# Patient Record
Sex: Female | Born: 1955 | Hispanic: Yes | Marital: Married
Health system: Southern US, Community
[De-identification: ages and names within clinical notes are randomized; demographics above are authoritative.]

## PROBLEM LIST (undated history)

## (undated) DIAGNOSIS — D649 Anemia, unspecified: Secondary | ICD-10-CM

## (undated) DIAGNOSIS — N819 Female genital prolapse, unspecified: Secondary | ICD-10-CM

## (undated) DIAGNOSIS — D509 Iron deficiency anemia, unspecified: Secondary | ICD-10-CM

## (undated) DIAGNOSIS — K219 Gastro-esophageal reflux disease without esophagitis: Secondary | ICD-10-CM

## (undated) DIAGNOSIS — I10 Essential (primary) hypertension: Secondary | ICD-10-CM

## (undated) DIAGNOSIS — Z972 Presence of dental prosthetic device (complete) (partial): Secondary | ICD-10-CM

## (undated) DIAGNOSIS — E119 Type 2 diabetes mellitus without complications: Secondary | ICD-10-CM

## (undated) HISTORY — PX: ABDOMINAL HYSTERECTOMY: SHX81

## (undated) HISTORY — PX: HEMORRHOID SURGERY: SHX153

## (undated) HISTORY — PX: VARICOSE VEIN SURGERY: SHX832

---

## 1988-04-22 HISTORY — PX: VAGINAL HYSTERECTOMY: SUR661

## 1998-04-22 HISTORY — PX: BREAST CYST ASPIRATION: SHX578

## 2000-04-22 HISTORY — PX: BREAST BIOPSY: SHX20

## 2004-10-05 ENCOUNTER — Ambulatory Visit: Payer: Self-pay | Admitting: Family Medicine

## 2005-04-24 ENCOUNTER — Ambulatory Visit: Payer: Self-pay | Admitting: Family Medicine

## 2006-05-13 ENCOUNTER — Ambulatory Visit: Payer: Self-pay | Admitting: Family Medicine

## 2007-06-16 ENCOUNTER — Ambulatory Visit: Payer: Self-pay | Admitting: Family Medicine

## 2008-06-21 ENCOUNTER — Ambulatory Visit: Payer: Self-pay | Admitting: Family Medicine

## 2009-06-22 ENCOUNTER — Ambulatory Visit: Payer: Self-pay | Admitting: Internal Medicine

## 2010-03-09 ENCOUNTER — Ambulatory Visit: Payer: Self-pay | Admitting: Gastroenterology

## 2010-06-28 ENCOUNTER — Ambulatory Visit: Payer: Self-pay | Admitting: Internal Medicine

## 2011-08-13 ENCOUNTER — Ambulatory Visit: Payer: Self-pay | Admitting: Internal Medicine

## 2012-09-01 ENCOUNTER — Ambulatory Visit: Payer: Self-pay

## 2012-12-10 ENCOUNTER — Ambulatory Visit: Payer: Self-pay | Admitting: Surgery

## 2012-12-10 DIAGNOSIS — I10 Essential (primary) hypertension: Secondary | ICD-10-CM

## 2012-12-10 LAB — HEMOGLOBIN: HGB: 14.2 g/dL (ref 12.0–16.0)

## 2012-12-18 ENCOUNTER — Ambulatory Visit: Payer: Self-pay | Admitting: Surgery

## 2012-12-22 LAB — PATHOLOGY REPORT

## 2013-11-16 ENCOUNTER — Ambulatory Visit: Payer: Self-pay

## 2013-11-26 ENCOUNTER — Ambulatory Visit: Payer: Self-pay

## 2014-08-12 NOTE — Op Note (Signed)
PATIENT NAME:  Erica Peters, Erica Peters MR#:  [id number] DATE OF BIRTH:  01-31-1956  DATE OF PROCEDURE:  12/18/2012  PREOPERATIVE DIAGNOSIS: Hemorrhoids.   POSTOPERATIVE DIAGNOSIS: Hemorrhoids.   PROCEDURE: Internal and external hemorrhoidectomy.   SURGEON: Rochel Brome, M.D.   ANESTHESIA: General.   INDICATIONS: This 59 year old has a complaint of hemorrhoid, which is on the outside, which has been bothering her for a period of several years, gradually increasing in size, and now has developed another adjacent hemorrhoid. Has had minor discomfort, occasional bright red bleeding. Hemorrhoids were demonstrated on physical exam and hemorrhoidectomy recommended for definitive treatment.   DESCRIPTION OF PROCEDURE: The patient was placed on the operating table under general anesthesia and placed in the lithotomy position. The anal area was prepared with Betadine solution and draped in a sterile manner. The largest hemorrhoid was noted which is on the patient's right side and anterior. Next largest was on the patient's right side and more posterior. The anoderm was infiltrated with Exparel. The anal canal was dilated large enough to admit 3 fingers. The bivalve and anal retractor was introduced and further gently dilated the anal canal. There was a large internal and external hemorrhoid complex at the 11 o'clock position and another at the eight o'clock position and another fairly prominent internal hemorrhoid at the two o'clock position.   The hemorrhoid at the 11 o'clock position was removed first. Using a 2-0 chromic suture ligature at the upper extent of the internal component, a V-shaped incision was made externally and the hemorrhoid was dissected free from surrounding structures and followed up over the internal ring and dissected up to the previously placed suture ligature. The hemorrhoid was further ligated with 2-0 chromic and excised. The wound was inspected. Several small bleeding points were  cauterized. The wound was closed with a running, locked, tied 2-0 chromic leaving a small opening externally for drainage.   Next, the hemorrhoid at the eight o'clock position was removed, making an elliptical excision, which engulfed both internal and external hemorrhoids, and this was excised. Numerous adjacent small hemorrhoids were cauterized, and the wound was closed with interrupted 3-0 chromic sutures leaving small opening externally for drainage. Next, there was another internal hemorrhoid just at the nine o'clock position, which was elevated with a pick-ups and crossclamped with Kelly clamp and ligated with 2-0 chromic. There was another internal hemorrhoid at the two o'clock position, which was ligated in a similar manner.   Subsequently, it appeared that hemostasis was intact. The additional Exparel for a total of 20 mL was injected into the anoderm and tissues surrounding the sphincter. Next, dressings were applied with paper tape. The patient tolerated the surgery satisfactorily and was prepared for transfer to the recovery room.     ____________________________ Lenna Sciara. Rochel Brome, MD jws:dmm D: 12/18/2012 08:44:00 ET T: 12/18/2012 10:02:48 ET JOB#: [id number]  cc: Loreli Dollar, MD, <Dictator> Loreli Dollar MD ELECTRONICALLY SIGNED 12/18/2012 17:22

## 2015-04-13 IMAGING — MG MM ADDITIONAL VIEWS AT NO CHARGE
1 series · 3 of 3 positions shown · non-contrast
Comparison: Previous examinations, including the screening
mammogram dated 11/16/2013.

CLINICAL DATA: Possible right breast mass at recent screening
mammography.

EXAM:
DIGITAL DIAGNOSTIC  RIGHT MAMMOGRAM
ULTRASOUND RIGHT BREAST

[R CC · right · 3 of 3 slices shown]
[im 1/3]
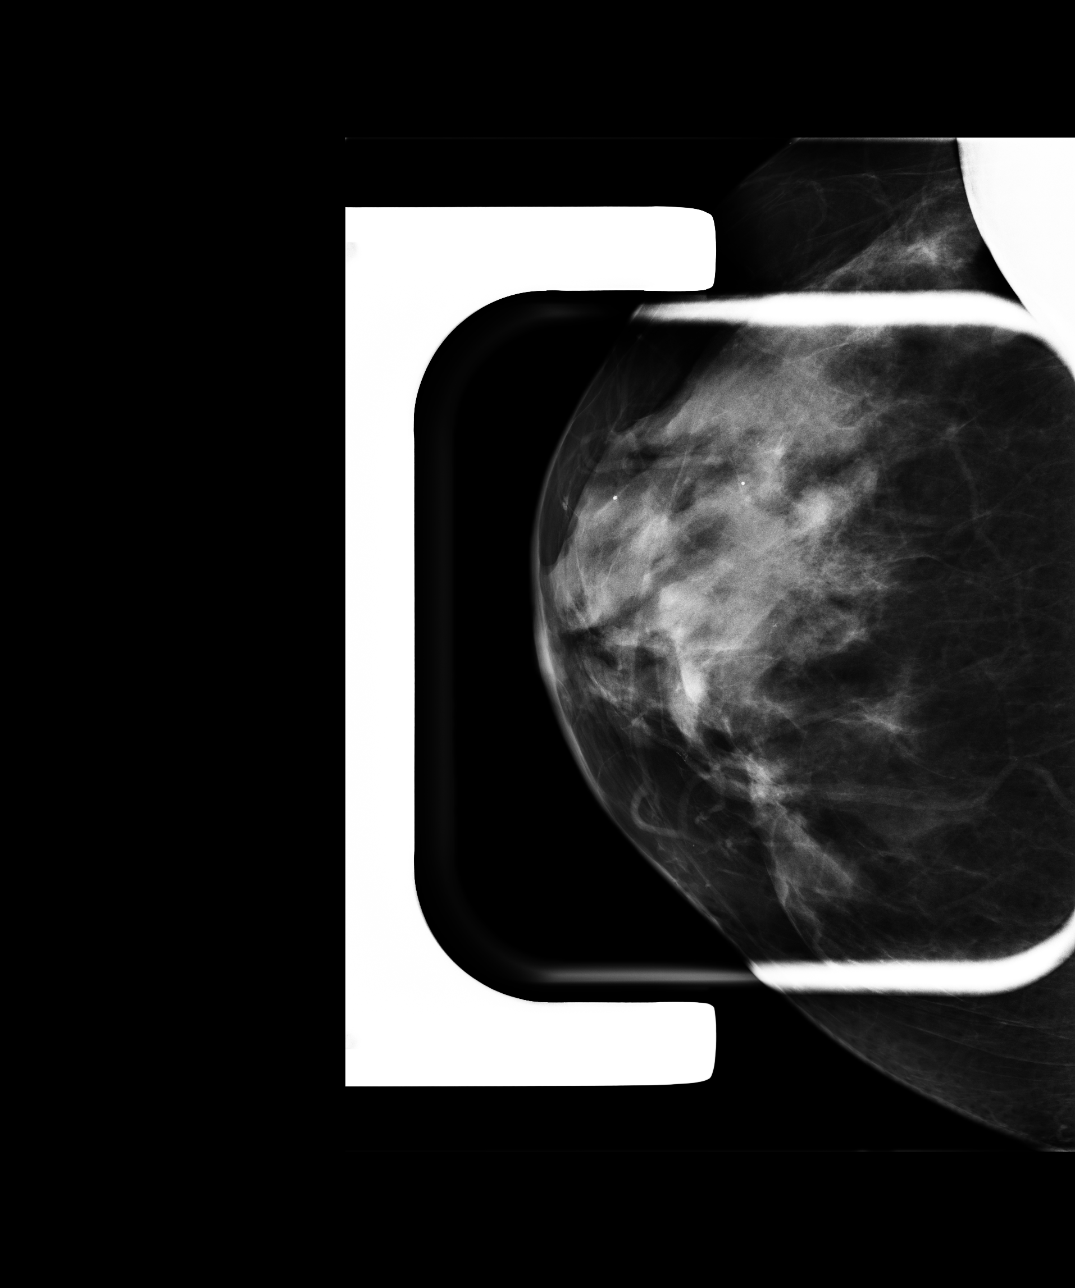
[im 2/3]
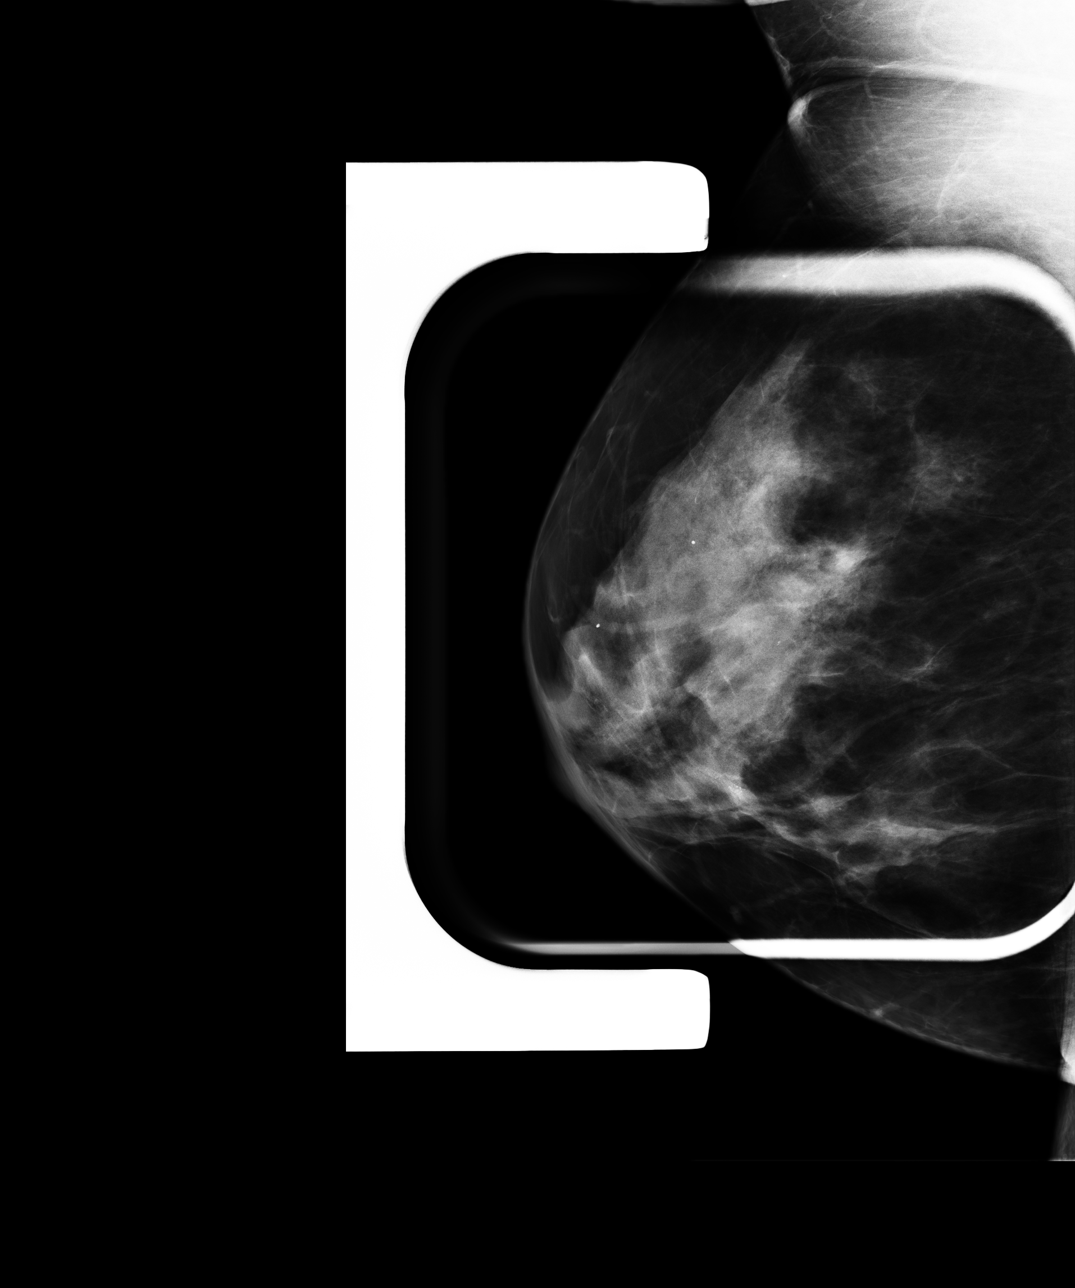
[im 3/3]
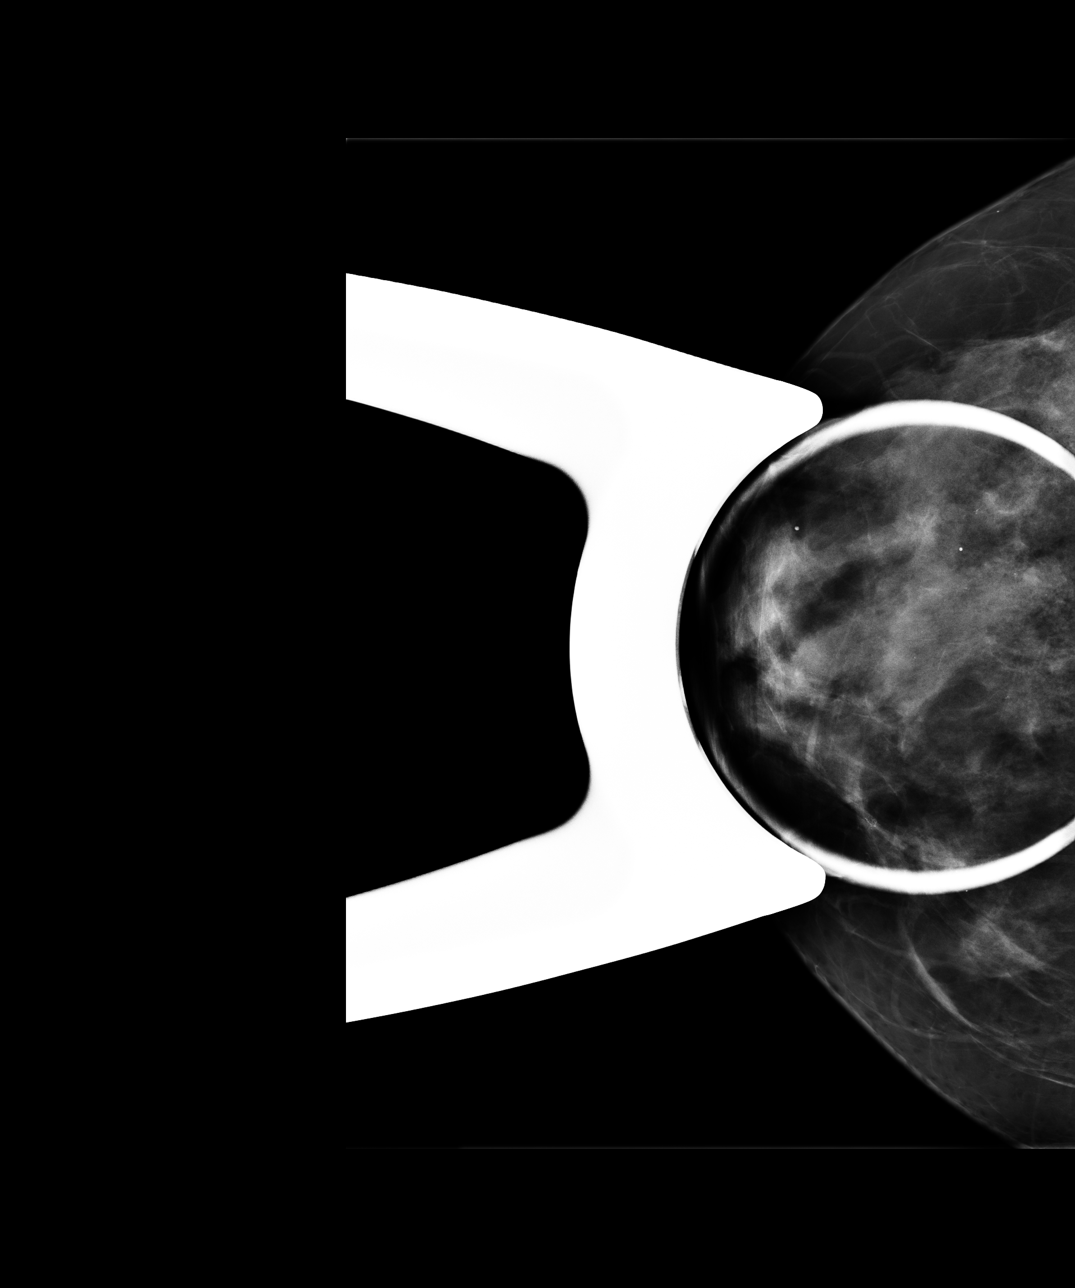

[3 of 3 positions shown; findings below may reference images not displayed]

ACR Breast Density Category c: The breast tissue is heterogeneously
dense, which may obscure small masses.
FINDINGS: Spot compression views of the right breast demonstrate normal
appearing dense glandular tissue at the location of the recently
suspected mass in the retroareolar region.

On physical exam, no mass is palpable in the retroareolar right
breast.

Ultrasound is performed, showing normal appearing breast tissue
throughout the right retroareolar and periareolar regions.
IMPRESSION: No evidence of malignancy. The recently suspected right breast mass
represented overlapping of normal glandular tissue.

RECOMMENDATION:
Bilateral screening mammogram in 1 year.

I have discussed the findings and recommendations with the patient.
Results were also provided in writing at the conclusion of the
visit. If applicable, a reminder letter will be sent to the patient
regarding the next appointment.

BI-RADS CATEGORY  1: Negative.

## 2015-06-14 ENCOUNTER — Other Ambulatory Visit: Payer: Self-pay | Admitting: Physician Assistant

## 2015-06-14 DIAGNOSIS — Z1231 Encounter for screening mammogram for malignant neoplasm of breast: Secondary | ICD-10-CM

## 2015-06-29 ENCOUNTER — Ambulatory Visit: Payer: Self-pay

## 2015-06-30 ENCOUNTER — Ambulatory Visit: Payer: Self-pay

## 2015-07-07 ENCOUNTER — Ambulatory Visit
Admission: RE | Admit: 2015-07-07 | Discharge: 2015-07-07 | Disposition: A | Discharge status: Home / Self Care | Payer: BLUE CROSS/BLUE SHIELD | Source: Ambulatory Visit | Attending: Physician Assistant | Admitting: Physician Assistant

## 2015-07-07 DIAGNOSIS — Z1231 Encounter for screening mammogram for malignant neoplasm of breast: Secondary | ICD-10-CM | POA: Insufficient documentation

## 2015-10-04 DIAGNOSIS — D509 Iron deficiency anemia, unspecified: Secondary | ICD-10-CM | POA: Insufficient documentation

## 2015-11-22 ENCOUNTER — Other Ambulatory Visit: Payer: Self-pay | Admitting: Podiatry

## 2015-11-22 DIAGNOSIS — M25571 Pain in right ankle and joints of right foot: Secondary | ICD-10-CM

## 2015-12-04 ENCOUNTER — Ambulatory Visit
Admission: RE | Admit: 2015-12-04 | Discharge: 2015-12-04 | Disposition: A | Discharge status: Home / Self Care | Payer: BLUE CROSS/BLUE SHIELD | Source: Ambulatory Visit | Attending: Podiatry | Admitting: Podiatry

## 2015-12-04 DIAGNOSIS — M19071 Primary osteoarthritis, right ankle and foot: Secondary | ICD-10-CM | POA: Diagnosis not present

## 2015-12-04 DIAGNOSIS — M25871 Other specified joint disorders, right ankle and foot: Secondary | ICD-10-CM | POA: Diagnosis not present

## 2015-12-04 DIAGNOSIS — R600 Localized edema: Secondary | ICD-10-CM | POA: Insufficient documentation

## 2015-12-04 DIAGNOSIS — M25571 Pain in right ankle and joints of right foot: Secondary | ICD-10-CM | POA: Diagnosis present

## 2016-01-18 ENCOUNTER — Inpatient Hospital Stay: Admission: RE | Admit: 2016-01-18 | Payer: BLUE CROSS/BLUE SHIELD | Source: Ambulatory Visit

## 2016-01-23 ENCOUNTER — Encounter
Admission: RE | Admit: 2016-01-23 | Discharge: 2016-01-23 | Disposition: A | Discharge status: Home / Self Care | Payer: BLUE CROSS/BLUE SHIELD | Source: Ambulatory Visit | Attending: Podiatry | Admitting: Podiatry

## 2016-01-23 DIAGNOSIS — D649 Anemia, unspecified: Secondary | ICD-10-CM | POA: Diagnosis not present

## 2016-01-23 DIAGNOSIS — M216X1 Other acquired deformities of right foot: Secondary | ICD-10-CM | POA: Diagnosis not present

## 2016-01-23 DIAGNOSIS — I10 Essential (primary) hypertension: Secondary | ICD-10-CM | POA: Diagnosis not present

## 2016-01-23 DIAGNOSIS — Z6833 Body mass index (BMI) 33.0-33.9, adult: Secondary | ICD-10-CM | POA: Diagnosis not present

## 2016-01-23 DIAGNOSIS — E669 Obesity, unspecified: Secondary | ICD-10-CM | POA: Diagnosis not present

## 2016-01-23 DIAGNOSIS — M19071 Primary osteoarthritis, right ankle and foot: Secondary | ICD-10-CM | POA: Diagnosis present

## 2016-01-23 HISTORY — DX: Anemia, unspecified: D64.9

## 2016-01-23 HISTORY — DX: Essential (primary) hypertension: I10

## 2016-01-23 NOTE — Patient Instructions (Signed)
  Your procedure is scheduled on: Friday 01/26/16 Report to Day Surgery. To find out your arrival time please call [phone] between 1PM - 3PM on Thurs. 01/25/16.  Remember: Instructions that are not followed completely may result in serious medical risk, up to and including death, or upon the discretion of your surgeon and anesthesiologist your surgery may need to be rescheduled.    _x__ 1. Do not eat food or drink liquids after midnight. No gum chewing or hard candies.     ___x_ 2. No Alcohol for 24 hours before or after surgery.   ____ 3. Do Not Smoke For 24 Hours Prior to Your Surgery.   ____ 4. Bring all medications with you on the day of surgery if instructed.    _x___ 5. Notify your doctor if there is any change in your medical condition     (cold, fever, infections).       Do not wear jewelry, make-up, hairpins, clips or nail polish.  Do not wear lotions, powders, or perfumes. You may wear deodorant.  Do not shave 48 hours prior to surgery. Men may shave face and neck.  Do not bring valuables to the hospital.    [facility] is not responsible for any belongings or valuables.               Contacts, dentures or bridgework may not be worn into surgery.  Leave your suitcase in the car. After surgery it may be brought to your room.  For patients admitted to the hospital, discharge time is determined by your                treatment team.   Patients discharged the day of surgery will not be allowed to drive home.   Please read over the following fact sheets that you were given:      __x__ Take these medicines the morning of surgery with A SIP OF WATER:    amLODipine (NORVASC) 5 MG tablet1.  2gabapentin (NEURONTIN) 100 MG capsule.   3.pantoprazole (PROTONIX) 40 MG tablet   4.  5.  6.  ____ Fleet Enema (as directed)   __x__ Use CHG Soap as directed  ____ Use inhalers on the day of surgery  ____ Stop metformin 2 days prior to surgery    ____ Take 1/2 of usual  insulin dose the night before surgery and none on the morning of surgery.   ____ Stop Coumadin/Plavix/aspirin on  __x__ Stop Anti-inflammatories on Meloxicam and osteo- bi flex ibuprofen, aspirin or aleve   ____ Stop supplements until after surgery.    ____ Bring C-Pap to the hospital.

## 2016-01-25 MED ORDER — CEFAZOLIN SODIUM-DEXTROSE 2-4 GM/100ML-% IV SOLN
2.0000 g | Freq: Once | INTRAVENOUS | Status: AC
  Administered 2016-01-26: 2 g via INTRAVENOUS

## 2016-01-26 ENCOUNTER — Ambulatory Visit: Payer: BLUE CROSS/BLUE SHIELD | Admitting: Anesthesiology

## 2016-01-26 ENCOUNTER — Encounter
Admission: RE | Disposition: A | Discharge status: Home / Self Care | Payer: Self-pay | Source: Ambulatory Visit | Attending: Podiatry

## 2016-01-26 ENCOUNTER — Ambulatory Visit
Admission: RE | Admit: 2016-01-26 | Discharge: 2016-01-26 | Disposition: A | Discharge status: Home / Self Care | Payer: BLUE CROSS/BLUE SHIELD | Source: Ambulatory Visit | Attending: Podiatry | Admitting: Podiatry

## 2016-01-26 ENCOUNTER — Encounter: Payer: Self-pay | Admitting: *Deleted

## 2016-01-26 ENCOUNTER — Ambulatory Visit: Payer: BLUE CROSS/BLUE SHIELD

## 2016-01-26 DIAGNOSIS — IMO0002 Reserved for concepts with insufficient information to code with codable children: Secondary | ICD-10-CM

## 2016-01-26 DIAGNOSIS — M216X1 Other acquired deformities of right foot: Secondary | ICD-10-CM | POA: Insufficient documentation

## 2016-01-26 DIAGNOSIS — R29898 Other symptoms and signs involving the musculoskeletal system: Secondary | ICD-10-CM

## 2016-01-26 DIAGNOSIS — M19071 Primary osteoarthritis, right ankle and foot: Secondary | ICD-10-CM | POA: Insufficient documentation

## 2016-01-26 DIAGNOSIS — Z6833 Body mass index (BMI) 33.0-33.9, adult: Secondary | ICD-10-CM | POA: Insufficient documentation

## 2016-01-26 DIAGNOSIS — E669 Obesity, unspecified: Secondary | ICD-10-CM | POA: Insufficient documentation

## 2016-01-26 DIAGNOSIS — D649 Anemia, unspecified: Secondary | ICD-10-CM | POA: Insufficient documentation

## 2016-01-26 DIAGNOSIS — I10 Essential (primary) hypertension: Secondary | ICD-10-CM | POA: Insufficient documentation

## 2016-01-26 HISTORY — PX: FOOT ARTHRODESIS: SHX1655

## 2016-01-26 HISTORY — PX: FLAT FOOT RECONSTRUCTION-TAL GASTROC RECESSION: SHX6620

## 2016-01-26 HISTORY — PX: TRIPLE SUBTALAR FUSION: SHX6633

## 2016-01-26 SURGERY — FLAT FOOT RECONSTRUCTION-TAL GASTROC RECESSION
Anesthesia: General | Laterality: Right | Wound class: Clean

## 2016-01-26 MED ORDER — MEPERIDINE HCL 25 MG/ML IJ SOLN: 6.2500 mg | INTRAMUSCULAR | Status: DC | PRN

## 2016-01-26 MED ORDER — FENTANYL CITRATE (PF) 100 MCG/2ML IJ SOLN
INTRAMUSCULAR | Status: DC | PRN
  Administered 2016-01-26: 50 ug via INTRAVENOUS
  Administered 2016-01-26: 25 ug via INTRAVENOUS
  Administered 2016-01-26: 50 ug via INTRAVENOUS
  Administered 2016-01-26 (×2): 25 ug via INTRAVENOUS
  Administered 2016-01-26: 100 ug via INTRAVENOUS
  Administered 2016-01-26: 25 ug via INTRAVENOUS

## 2016-01-26 MED ORDER — SUCCINYLCHOLINE CHLORIDE 20 MG/ML IJ SOLN
INTRAMUSCULAR | Status: DC | PRN
  Administered 2016-01-26: 80 mg via INTRAVENOUS

## 2016-01-26 MED ORDER — ONDANSETRON HCL 4 MG/2ML IJ SOLN
INTRAMUSCULAR | Status: DC | PRN
  Administered 2016-01-26: 4 mg via INTRAVENOUS

## 2016-01-26 MED ORDER — ONDANSETRON HCL 4 MG/2ML IJ SOLN: 4.0000 mg | Freq: Four times a day (QID) | INTRAMUSCULAR | Status: DC | PRN

## 2016-01-26 MED ORDER — ONDANSETRON HCL 4 MG PO TABS: 4.0000 mg | ORAL_TABLET | Freq: Four times a day (QID) | ORAL | Status: DC | PRN

## 2016-01-26 MED ORDER — BUPIVACAINE HCL (PF) 0.25 % IJ SOLN
INTRAMUSCULAR | Status: AC
  Filled 2016-01-26: qty 30

## 2016-01-26 MED ORDER — EPHEDRINE SULFATE 50 MG/ML IJ SOLN
INTRAMUSCULAR | Status: DC | PRN
  Administered 2016-01-26: 10 mg via INTRAVENOUS
  Administered 2016-01-26: 5 mg via INTRAVENOUS
  Administered 2016-01-26: 10 mg via INTRAVENOUS
  Administered 2016-01-26: 5 mg via INTRAVENOUS
  Administered 2016-01-26: 10 mg via INTRAVENOUS

## 2016-01-26 MED ORDER — LIDOCAINE-EPINEPHRINE 1 %-1:100000 IJ SOLN
INTRAMUSCULAR | Status: AC
  Filled 2016-01-26: qty 1

## 2016-01-26 MED ORDER — LIDOCAINE HCL (PF) 1 % IJ SOLN
INTRAMUSCULAR | Status: AC
Start: 2016-01-26 — End: 2016-01-26
  Filled 2016-01-26: qty 30

## 2016-01-26 MED ORDER — ACETAMINOPHEN 10 MG/ML IV SOLN
INTRAVENOUS | Status: AC
  Filled 2016-01-26: qty 100

## 2016-01-26 MED ORDER — OXYCODONE HCL 5 MG/5ML PO SOLN: 5.0000 mg | Freq: Once | ORAL | Status: AC | PRN

## 2016-01-26 MED ORDER — PROMETHAZINE HCL 25 MG/ML IJ SOLN: 6.2500 mg | INTRAMUSCULAR | Status: DC | PRN

## 2016-01-26 MED ORDER — LIDOCAINE-EPINEPHRINE 1 %-1:100000 IJ SOLN
INTRAMUSCULAR | Status: DC | PRN
  Administered 2016-01-26: 8 mL

## 2016-01-26 MED ORDER — FENTANYL CITRATE (PF) 100 MCG/2ML IJ SOLN: 25.0000 ug | INTRAMUSCULAR | Status: DC | PRN

## 2016-01-26 MED ORDER — ROPIVACAINE HCL 5 MG/ML IJ SOLN
INTRAMUSCULAR | Status: DC | PRN
  Administered 2016-01-26: 30 mL via PERINEURAL

## 2016-01-26 MED ORDER — OXYCODONE-ACETAMINOPHEN 5-325 MG PO TABS: 1.0000 | ORAL_TABLET | ORAL | Status: DC | PRN

## 2016-01-26 MED ORDER — DEXAMETHASONE SODIUM PHOSPHATE 10 MG/ML IJ SOLN
INTRAMUSCULAR | Status: DC | PRN
  Administered 2016-01-26: 10 mg via INTRAVENOUS

## 2016-01-26 MED ORDER — ACETAMINOPHEN 10 MG/ML IV SOLN
INTRAVENOUS | Status: DC | PRN
  Administered 2016-01-26: 1000 mg via INTRAVENOUS

## 2016-01-26 MED ORDER — LACTATED RINGERS IV SOLN
INTRAVENOUS | Status: DC
  Administered 2016-01-26 (×2): via INTRAVENOUS

## 2016-01-26 MED ORDER — BUPIVACAINE HCL 0.25 % IJ SOLN
INTRAMUSCULAR | Status: DC | PRN
  Administered 2016-01-26: 20 mL

## 2016-01-26 MED ORDER — BUPIVACAINE HCL (PF) 0.5 % IJ SOLN
INTRAMUSCULAR | Status: AC
  Filled 2016-01-26: qty 30

## 2016-01-26 MED ORDER — OXYCODONE HCL 5 MG PO TABS
5.0000 mg | ORAL_TABLET | Freq: Once | ORAL | Status: AC | PRN
  Administered 2016-01-26: 5 mg via ORAL

## 2016-01-26 MED ORDER — PROPOFOL 10 MG/ML IV BOLUS
INTRAVENOUS | Status: DC | PRN
  Administered 2016-01-26: 130 mg via INTRAVENOUS

## 2016-01-26 MED ORDER — OXYCODONE HCL 5 MG PO TABS
ORAL_TABLET | ORAL | Status: AC
  Filled 2016-01-26: qty 1

## 2016-01-26 MED ORDER — MIDAZOLAM HCL 2 MG/2ML IJ SOLN
INTRAMUSCULAR | Status: DC | PRN
  Administered 2016-01-26 (×2): 1 mg via INTRAVENOUS
  Administered 2016-01-26: 2 mg via INTRAVENOUS

## 2016-01-26 MED ORDER — OXYCODONE-ACETAMINOPHEN 5-325 MG PO TABS: 1.0000 | ORAL_TABLET | ORAL | 0 refills | Status: DC | PRN

## 2016-01-26 MED ORDER — CEFAZOLIN SODIUM-DEXTROSE 2-4 GM/100ML-% IV SOLN
INTRAVENOUS | Status: AC
  Filled 2016-01-26: qty 100

## 2016-01-26 SURGICAL SUPPLY — 100 items
1.6 mm wire ×8 IMPLANT
2.3 mm wire ×4 IMPLANT
2.4 mm drill bit ×2 IMPLANT
4.6 mm cannulated drill ×2 IMPLANT
7.0 countersink ×2 IMPLANT
BAG COUNTER SPONGE EZ (MISCELLANEOUS) IMPLANT
BANDAGE ELASTIC 4 LF NS (GAUZE/BANDAGES/DRESSINGS) IMPLANT
BANDAGE STRETCH 3X4.1 STRL (GAUZE/BANDAGES/DRESSINGS) IMPLANT
BASIN GRAD PLASTIC 32OZ STRL (MISCELLANEOUS) ×2 IMPLANT
BIT DRILL 2.4X140 LONG SOLID (BIT) ×2 IMPLANT
BIT DRILL CANNULATED 4.6 (BIT) ×2 IMPLANT
BLADE MED AGGRESSIVE (BLADE) IMPLANT
BLADE OSCILLATING/SAGITTAL (BLADE)
BLADE SURG 15 STRL LF DISP TIS (BLADE) ×3 IMPLANT
BLADE SURG 15 STRL SS (BLADE) ×3
BLADE SURG MINI STRL (BLADE) ×2 IMPLANT
BLADE SW THK.38XMED LNG THN (BLADE) IMPLANT
BNDG COHESIVE 4X5 TAN STRL (GAUZE/BANDAGES/DRESSINGS) ×2 IMPLANT
BNDG ESMARK 4X12 TAN STRL LF (GAUZE/BANDAGES/DRESSINGS) ×2 IMPLANT
BNDG GAUZE 4.5X4.1 6PLY STRL (MISCELLANEOUS) IMPLANT
BONE MATRIX TRINITY 10CC (Bone Implant) ×2 IMPLANT
BUR 4X45 EGG (BURR) ×2 IMPLANT
BUR 4X55 1 (BURR) IMPLANT
CANISTER SUCT 1200ML W/VALVE (MISCELLANEOUS) ×2 IMPLANT
CATH TRAY METER 16FR LF (MISCELLANEOUS) IMPLANT
COUNTERSINK 7.0 (MISCELLANEOUS) ×2
COVER PIN YLW 0.028-062 (MISCELLANEOUS) IMPLANT
CUFF TOURN 18 STER (MISCELLANEOUS) IMPLANT
CUFF TOURN SGL QUICK 24 (TOURNIQUET CUFF)
CUFF TRNQT CYL 24X4X40X1 (TOURNIQUET CUFF) IMPLANT
DRAPE C-ARM XRAY 36X54 (DRAPES) ×2 IMPLANT
DRAPE C-ARMOR (DRAPES) ×2 IMPLANT
DRAPE FLUOR MINI C-ARM 54X84 (DRAPES) IMPLANT
DRAPE SURG 17X11 SM STRL (DRAPES) ×2 IMPLANT
DRSG TEGADERM 4X4.75 (GAUZE/BANDAGES/DRESSINGS) IMPLANT
DURAPREP 26ML APPLICATOR (WOUND CARE) ×4 IMPLANT
ELECT REM PT RETURN 9FT ADLT (ELECTROSURGICAL) ×2
ELECTRODE REM PT RTRN 9FT ADLT (ELECTROSURGICAL) ×1 IMPLANT
GAUZE PETRO XEROFOAM 1X8 (MISCELLANEOUS) IMPLANT
GAUZE STRETCH 2X75IN STRL (MISCELLANEOUS) IMPLANT
GLOVE BIO SURGEON STRL SZ7.5 (GLOVE) ×6 IMPLANT
GLOVE INDICATOR 8.0 STRL GRN (GLOVE) ×6 IMPLANT
GOWN STRL REUS W/ TWL LRG LVL3 (GOWN DISPOSABLE) ×3 IMPLANT
GOWN STRL REUS W/TWL LRG LVL3 (GOWN DISPOSABLE) ×3
K-WIRE 2.3 (WIRE) ×4 IMPLANT
K-WIRE SMOOTH 1.6X150MM (WIRE) ×8
KIT RM TURNOVER STRD PROC AR (KITS) ×2 IMPLANT
KIT SHOULDER TRACTION (DRAPES) IMPLANT
KWIRE SMOOTH 1.6X150MM (WIRE) ×4 IMPLANT
LABEL OR SOLS (LABEL) ×2 IMPLANT
MANIPULATOR VCARE STD CRV RETR (MISCELLANEOUS) IMPLANT
NDL SAFETY 18GX1.5 (NEEDLE) ×2 IMPLANT
NEEDLE FILTER BLUNT 18X 1/2SAF (NEEDLE)
NEEDLE FILTER BLUNT 18X1 1/2 (NEEDLE) IMPLANT
NEEDLE HYPO 25X1 1.5 SAFETY (NEEDLE) ×6 IMPLANT
NS IRRIG 1000ML POUR BTL (IV SOLUTION) ×2 IMPLANT
NS IRRIG 500ML POUR BTL (IV SOLUTION) ×2 IMPLANT
PACK EXTREMITY ARMC (MISCELLANEOUS) ×2 IMPLANT
PADDING CAST BLEND 4X4 NS (MISCELLANEOUS) IMPLANT
PENCIL ELECTRO HAND CTR (MISCELLANEOUS) ×2 IMPLANT
PLATE 20MM DOGBONE (Plate) ×2 IMPLANT
SCREW 3.5X26 NONLOCKING (Screw) ×2 IMPLANT
SCREW 3.5X32 NONLOCKING (Screw) ×2 IMPLANT
SCREW 7.0X60 ST (Screw) ×2 IMPLANT
SCREW COUNTERSINK 7.0 (MISCELLANEOUS) ×1 IMPLANT
SCREW LOCK PLATE R3 3.5X20 (Screw) ×4 IMPLANT
SCREW LOCK PLATE R3 3.5X22 (Screw) ×2 IMPLANT
SCREW LOCK PLATE R3 3.5X24 (Screw) ×2 IMPLANT
SPLINT CAST 1 STEP 4X30 (MISCELLANEOUS) IMPLANT
SPLINT FAST PLASTER 5X30 (CAST SUPPLIES)
SPLINT PLASTER CAST FAST 5X30 (CAST SUPPLIES) IMPLANT
SPONGE XRAY 4X4 16PLY STRL (MISCELLANEOUS) ×2 IMPLANT
STAPLE NIT SUPER 18X18X15 (Staple) ×2 IMPLANT
STOCKINETTE M/LG 89821 (MISCELLANEOUS) ×2 IMPLANT
STOCKINETTE STRL 6IN 960660 (GAUZE/BANDAGES/DRESSINGS) ×2 IMPLANT
STRAP SAFETY BODY (MISCELLANEOUS) ×2 IMPLANT
STRIP CLOSURE SKIN 1/4X4 (GAUZE/BANDAGES/DRESSINGS) ×2 IMPLANT
SUT ETHILON 3-0 (SUTURE) ×4 IMPLANT
SUT ETHILON 3-0 FS-10 30 BLK (SUTURE) ×2
SUT ETHILON 4-0 (SUTURE)
SUT ETHILON 4-0 FS2 18XMFL BLK (SUTURE)
SUT ETHILON 5-0 FS-2 18 BLK (SUTURE) IMPLANT
SUT MNCRL 4-0 (SUTURE) ×1
SUT MNCRL 4-0 27XMFL (SUTURE) ×1
SUT MNCRL+ 5-0 UNDYED PC-3 (SUTURE) IMPLANT
SUT MONOCRYL 5-0 (SUTURE)
SUT VIC AB 2-0 CT2 27 (SUTURE) IMPLANT
SUT VIC AB 3-0 SH 27 (SUTURE) ×1
SUT VIC AB 3-0 SH 27X BRD (SUTURE) ×1 IMPLANT
SUT VIC AB 4-0 FS2 27 (SUTURE) ×4 IMPLANT
SUT VICRYL AB 3-0 FS1 BRD 27IN (SUTURE) ×2 IMPLANT
SUTURE EHLN 3-0 FS-10 30 BLK (SUTURE) ×1 IMPLANT
SUTURE ETHLN 4-0 FS2 18XMF BLK (SUTURE) IMPLANT
SUTURE MNCRL 4-0 27XMF (SUTURE) ×1 IMPLANT
SYR 3ML LL SCALE MARK (SYRINGE) IMPLANT
SYRINGE 10CC LL (SYRINGE) ×4 IMPLANT
TOWEL OR 17X26 4PK STRL BLUE (TOWEL DISPOSABLE) ×2 IMPLANT
WIRE MAGNUM (SUTURE) IMPLANT
WIRE Z .045 C-WIRE SPADE TIP (WIRE) IMPLANT
WIRE Z .062 C-WIRE SPADE TIP (WIRE) IMPLANT

## 2016-01-26 NOTE — Discharge Instructions (Signed)
These instructions will give you an idea of what to expect after surgery and how to manage issues that may arise before your first post op office visit.  Pain Management Pain is best managed by staying ahead of it. If pain gets out of control, it is difficult to get it back under control. Local anesthesia that lasts 6-8 hours and may last 24 hours is used to numb the foot and decrease pain.  For the best pain control, take the pain medication every 4 hours for the first 2 days post op. On the third day pain medication can be taken as needed.   Post Op Nausea Nausea is common after surgery, so it is managed proactively.  If prescribed, use the prescribed nausea medication regularly for the first 2 days post op.  Bandages Do not worry if there is blood on the bandage. What looks like a lot of blood on the bandage is actually a small amount. Blood on the dressing spreads out as it is absorbed by the gauze, the same way a drop of water spreads out on a paper towel.    If the bandages feel wet or dry, stiff and uncomfortable, call the office during office hours and we will schedule a time for you to have the bandage changed.  Unless you are specifically told otherwise, we will do the first bandage change in the office.  Keep your bandage dry. If the bandage becomes wet or soiled, notify the office and we will schedule a time to change the bandage.  Activity It is best to spend most of the first 2 days after surgery lying down with the foot elevated above the level of your heart. You may put weight on your heel while wearing the surgical shoe.   You may only get up to go to the restroom.  Driving Do not drive until you are able to respond in an emergency (i.e. slam on the brakes). This usually occurs after the bone has healed - 6 to 8 weeks.  Call the Office If you have a fever over 101F.  If you have increasing pain after the initial post op pain has settled down.  If you have increasing  redness, swelling, or drainage.  If you have any questions or concerns. [place] DR. [place]   Take your medication as prescribed.  Pain medication should be taken only as needed.  Keep the dressing clean, dry and intact.  Keep your foot elevated above the heart level for the first 48 hours.  Walking to the bathroom and brief periods of walking are acceptable, unless we have instructed you to be non-weight bearing.  Always wear your post-op shoe when walking.  Always use your crutches if you are to be non-weight bearing.  Do not take a shower. Baths are permissible as long as the foot is kept out of the water.   Every hour you are awake:  Bend your knee 15 times.   Call [facility] [phone]) if any of the following problems occur: You develop a temperature or fever. The bandage becomes saturated with blood. Medication does not stop your pain. Injury of the foot occurs. Any symptoms of infection including redness, odor, or red streaks running from wound.    CIRUGIA AMBULATORIA       Instruccionnes de alta    Date Toma Copier) 01/26/16   1.  Las [employer]  se le Engineer, manufacturing en su cuerpo [employer], asi      que por las proximas 24 horas usted no debe:   Conducir Scientist, research (medical)) un automovil   Hacer ninguna decision legal   Tomar ninguna bebida alcoholica  2.  A) [place] puede comenzar una dieta regular.  Es mejor que hoy empiece con                    liquidos y gradualmente anada comidas solidas.       B) Puede comer cualquier comida que desee pero es mejor empezar con liquidos,               luego sopitas con galletas saladas y gradualmente llegar a las comidas solidas.  3.  Por favor avise a su medico inmediatamente si usted tiene algun sangrado anormal,       tiene dificultad con la respiracion, enrojecimiento y Social research officer, government en el  sitio de la cirugia,     [place], fiebro o dolor que se alivia con [place].  4.  A) Su visita posoperatoria (despues de su operacion) es con el  Dr. Vickki Muff  Date 02/01/16                  Time 3:30 pm        B)  Por favor llame para hacer la cita posoperatoria.  5.  Istrucciones especificas :

## 2016-01-26 NOTE — Anesthesia Procedure Notes (Signed)
Anesthesia Regional Block:  Popliteal block  Pre-Anesthetic Checklist: ,, timeout performed, Correct Patient, Correct Site, Correct Laterality, Correct Procedure, Correct Position, site marked, Risks and benefits discussed,  Surgical consent,  Pre-op evaluation,  At surgeon's request and post-op pain management  Laterality: Right  Prep: chloraprep       Needles:  Injection technique: Single-shot  Needle Type: Stimiplex     Needle Length: 9cm 9 cm Needle Gauge: 22 and 22 G    Additional Needles:  Procedures: ultrasound guided (picture in chart) Popliteal block Narrative:  Start time: 01/26/2016 7:40 AM End time: 01/26/2016 7:50 AM Injection made incrementally with aspirations every 5 mL.  Performed by: Personally  Anesthesiologist: Carrol Bondar

## 2016-01-26 NOTE — Transfer of Care (Signed)
Immediate Anesthesia Transfer of Care Note  Patient: Erica Peters  Procedure(s) Performed: Procedure(s): FLAT FOOT RECONSTRUCTION-TAL GASTROC RECESSION (Right) ARTHRODESIS FOOT (Right) TRIPLE SUBTALAR FUSION (Right)  Patient Location: PACU  Anesthesia Type:General  Level of Consciousness: patient cooperative and lethargic  Airway & Oxygen Therapy: Patient Spontanous Breathing and Patient connected to face mask oxygen  Post-op Assessment: Report given to RN and Post -op Vital signs reviewed and stable  Post vital signs: Reviewed and stable  Last Vitals:  Vitals:   01/26/16 0625 01/26/16 1106  BP: (!) 145/109 (!) 110/56  Pulse: 82 73  Resp: 18 19  Temp: 36.6 C 36.9 C    Last Pain:  Vitals:   01/26/16 1106  TempSrc:   PainSc: Asleep         Complications: No apparent anesthesia complications

## 2016-01-26 NOTE — Anesthesia Procedure Notes (Signed)
Procedure Name: Intubation Date/Time: 01/26/2016 7:46 AM Performed by: Jonna Clark Pre-anesthesia Checklist: Patient identified, Patient being monitored, Timeout performed, Emergency Drugs available and Suction available Patient Re-evaluated:Patient Re-evaluated prior to inductionOxygen Delivery Method: Circle system utilized Preoxygenation: Pre-oxygenation with 100% oxygen Intubation Type: IV induction Ventilation: Mask ventilation without difficulty Laryngoscope Size: Miller and 2 Grade View: Grade II Tube type: Oral Tube size: 7.0 mm Number of attempts: 1 Placement Confirmation: ETT inserted through vocal cords under direct vision,  positive ETCO2 and breath sounds checked- equal and bilateral Secured at: 21 cm Tube secured with: Tape Dental Injury: Teeth and Oropharynx as per pre-operative assessment

## 2016-01-26 NOTE — Anesthesia Postprocedure Evaluation (Signed)
Anesthesia Post Note  Patient: Erica Peters  Procedure(s) Performed: Procedure(s) (LRB): FLAT FOOT RECONSTRUCTION-TAL GASTROC RECESSION (Right) ARTHRODESIS FOOT (Right) TRIPLE SUBTALAR FUSION (Right)  Patient location during evaluation: PACU Anesthesia Type: General Level of consciousness: awake and alert and oriented Pain management: pain level controlled Vital Signs Assessment: post-procedure vital signs reviewed and stable Respiratory status: spontaneous breathing, nonlabored ventilation and respiratory function stable Cardiovascular status: blood pressure returned to baseline and stable Postop Assessment: no signs of nausea or vomiting Anesthetic complications: no    Last Vitals:  Vitals:   01/26/16 1149 01/26/16 1157  BP: 113/60 (!) 110/59  Pulse: 88 88  Resp: 19 16  Temp: 36.8 C 37 C    Last Pain:  Vitals:   01/26/16 1157  TempSrc: Temporal  PainSc:                  Treasure Ochs

## 2016-01-26 NOTE — Progress Notes (Signed)
Voided x2 

## 2016-01-26 NOTE — Op Note (Signed)
Operative note   Surgeon:Ebonye Reade    Assistant:none    Preop diagnosis:1. Equinus right lower leg  2.Subtalar joint arthritis right foot  3.Talo-navicular joint arthritis    Postop diagnosis:same    Procedure:1. Rica Mote gastroc recession right lower leg  2. Subtalar joint fusion  3. Talonavicular joint fusion    EBL: 25 ML's    Anesthesia:Popliteal block    Hemostasis: Thigh tourniquet inflated to 250 mmHg for 120 minutes    Specimen: None    Complications: None    Operative indications:Erica Peters is an 60 y.o. that presents today for surgical intervention.  The risks/benefits/alternatives/complications have been discussed and consent has been given.    Procedure:  Patient was brought into the OR and placed on the operating table in thesupine position. After anesthesia was obtained theright lower extremity was prepped and draped in usual sterile fashion.  Attention was initially directed to the posterior aspect of the calf where long incision site this was injected with 9 cc of 1% lidocaine with epinephrine. A longitudinal incision was made at the gastrocsoleus junction. Sharp and blunt dissection carried down to the peritenon. The peritenon was incised longitudinally. The small saphenous and sural nerve were retracted throughout the procedure. A Strayer medial to lateral gastroc recession was noted. Good excursion of the foot was noted. The wound was flushed with copious amounts or irrigation. The peritenon was closed with 4-0 Vicryl in the subcutaneous tissue closed with 4-0 Vicryl. The skin closed with a 4-0 Monocryl.  This area was then covered and dressed and inflation of the tourniquet was performed. Attention was directed to the lateral aspect of the subtalar joint where ollier  type of incision was performed. This was taken from the distal tip of the fibula overlying the calcaneocuboid joint region. The extensor digitorum brevis muscle belly was noted and  reflected distally. The subtalar joint was then entered. There was noted be some arthritic changes with loss of the posterior aspect of the subtalar joint articular cartilage. At this time the subtalar joint was then repaired with removal of all residual articular cartilage from the posterior medial and anterior facets. This was taken down to subchondral bone. The area was then smoothed with a power bur and drilled with a 1.5 mm drill bit.  Attention was then directed to the anterior aspect of the foot overlying the talonavicular joint. Just lateral to the anterior tibial tendon longitudinal incision was made. Sharp and blunt dissection carried down to the subcutaneous tissue. Care was taken to retract all vital neurovascular structures. The talonavicular joint was then entered and exposed medial and laterally. Using curettage and was able to remove all the articular cartilage. This was taken down to subchondral bone this was then smoothed with a power bur and drilled with a 1.5 mm drill bit. After preparation of all joints and flushing of all joints. Each area was then packed with Trinity bone graft. At this time a compression screw was placed from the posterior aspect of the calcaneus crossing the subtalar joint with excellent compression and alignment. This was noted in all views. This was from the [facility] 28 implant set. This screw was a 7.0 x 60 mm screw. Excellent compression was noted at this time.  The talonavicular joint was then held in a anatomic position and initially a compression staple was placed across the dorsal aspect of the TN joint. This was then 18 x 15 compression staple. Finally a 928 4 hole compression plate was placed across the  talonavicular joint. 43.5 mm locking screws were used. One 3.5 mm nonlocking screw was used for compression. Alignment and compression and stability was noted in all 3 planes. All superficial wounds were then flushed with copious varus or irrigation. After final  x-rays performed. Layered closure was performed with 304 0 Vicryl for the deeper and subcutaneous tissues. Skin was closed with a 3-0 nylon. 0.25% Marcaine was placed around all areas. A well compressive sterile dressing was then applied. She was then placed in a equalizer walker boot for nonweightbearing.    Patient tolerated the procedure and anesthesia well.  Was transported from the OR to the PACU with all vital signs stable and vascular status intact. To be discharged per routine protocol.  Will follow up in approximately 1 week in the outpatient clinic.

## 2016-01-26 NOTE — Anesthesia Preprocedure Evaluation (Addendum)
Anesthesia Evaluation  Patient identified by MRN, date of birth, ID band Patient awake    Reviewed: Allergy & Precautions, NPO status , Patient's Chart, lab work & pertinent test results  History of Anesthesia Complications Negative for: history of anesthetic complications  Airway Mallampati: III  TM Distance: >3 FB Neck ROM: Full    Dental  (+) Missing, Poor Dentition   Pulmonary neg pulmonary ROS, neg sleep apnea, neg COPD,    breath sounds clear to auscultation- rhonchi (-) wheezing      Cardiovascular Exercise Tolerance: Good hypertension, Pt. on medications (-) CAD and (-) Past MI  Rhythm:Regular Rate:Normal - Systolic murmurs and - Diastolic murmurs    Neuro/Psych negative neurological ROS  negative psych ROS   GI/Hepatic negative GI ROS, Neg liver ROS,   Endo/Other  negative endocrine ROSneg diabetes  Renal/GU negative Renal ROS     Musculoskeletal   Abdominal (+) + obese,   Peds  Hematology  (+) anemia ,   Anesthesia Other Findings Past Medical History: No date: Anemia No date: Hypertension   Reproductive/Obstetrics                             Anesthesia Physical Anesthesia Plan  ASA: II  Anesthesia Plan: General   Post-op Pain Management:  Regional for Post-op pain   Induction: Intravenous  Airway Management Planned: Oral ETT  Additional Equipment:   Intra-op Plan:   Post-operative Plan: Extubation in OR  Informed Consent: I have reviewed the patients History and Physical, chart, labs and discussed the procedure including the risks, benefits and alternatives for the proposed anesthesia with the patient or authorized representative who has indicated his/her understanding and acceptance.   Dental advisory given  Plan Discussed with: CRNA and Anesthesiologist  Anesthesia Plan Comments:         Anesthesia Quick Evaluation

## 2016-01-26 NOTE — H&P (Signed)
HISTORY AND PHYSICAL INTERVAL NOTE:  01/26/2016  7:14 AM  Erica Peters  has presented today for surgery, with the diagnosis of ACUTE FOOT PAIN.  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  The various methods of treatment have been discussed with the patient.  No guarantees were given.  After consideration of risks, benefits and other options for treatment, the patient has consented to surgery.  I have reviewed the patients' chart and labs.    Patient Vitals for the past 24 hrs:  BP Temp Temp src Pulse Resp SpO2 Height Weight  01/26/16 0625 (!) 145/109 97.9 F (36.6 C) Tympanic 82 18 98 % 5' (1.524 m) 77.1 kg (170 lb)    Peters history and physical examination was performed in my office.  The patient was reexamined.  There have been no changes to this history and physical examination.  Erica Peters

## 2016-01-26 NOTE — Progress Notes (Signed)
Toes warm to touch on right

## 2016-01-29 ENCOUNTER — Encounter: Payer: Self-pay | Admitting: Podiatry

## 2016-02-20 ENCOUNTER — Encounter: Payer: Self-pay | Admitting: Podiatry

## 2016-06-26 ENCOUNTER — Other Ambulatory Visit: Payer: Self-pay | Admitting: Physician Assistant

## 2016-06-26 DIAGNOSIS — K219 Gastro-esophageal reflux disease without esophagitis: Secondary | ICD-10-CM | POA: Insufficient documentation

## 2016-06-26 DIAGNOSIS — Z1231 Encounter for screening mammogram for malignant neoplasm of breast: Secondary | ICD-10-CM

## 2016-07-23 ENCOUNTER — Ambulatory Visit
Admission: RE | Admit: 2016-07-23 | Discharge: 2016-07-23 | Disposition: A | Discharge status: Home / Self Care | Payer: BLUE CROSS/BLUE SHIELD | Source: Ambulatory Visit | Attending: Physician Assistant | Admitting: Physician Assistant

## 2016-07-23 DIAGNOSIS — Z1231 Encounter for screening mammogram for malignant neoplasm of breast: Secondary | ICD-10-CM | POA: Diagnosis not present

## 2017-01-01 DIAGNOSIS — T148XXA Other injury of unspecified body region, initial encounter: Secondary | ICD-10-CM | POA: Insufficient documentation

## 2017-03-04 DIAGNOSIS — R208 Other disturbances of skin sensation: Secondary | ICD-10-CM | POA: Insufficient documentation

## 2017-03-04 DIAGNOSIS — R200 Anesthesia of skin: Secondary | ICD-10-CM | POA: Insufficient documentation

## 2017-04-01 ENCOUNTER — Other Ambulatory Visit: Payer: Self-pay | Admitting: Physician Assistant

## 2017-04-01 DIAGNOSIS — M5416 Radiculopathy, lumbar region: Secondary | ICD-10-CM

## 2017-04-09 ENCOUNTER — Ambulatory Visit
Admission: RE | Admit: 2017-04-09 | Discharge: 2017-04-09 | Disposition: A | Discharge status: Home / Self Care | Payer: No Typology Code available for payment source | Source: Ambulatory Visit | Attending: Physician Assistant | Admitting: Physician Assistant

## 2017-04-09 DIAGNOSIS — M5416 Radiculopathy, lumbar region: Secondary | ICD-10-CM | POA: Insufficient documentation

## 2017-04-09 DIAGNOSIS — M48061 Spinal stenosis, lumbar region without neurogenic claudication: Secondary | ICD-10-CM | POA: Diagnosis not present

## 2017-04-09 LAB — POCT I-STAT CREATININE: Creatinine, Ser: 0.6 mg/dL (ref 0.44–1.00)

## 2017-04-09 MED ORDER — GADOBENATE DIMEGLUMINE 529 MG/ML IV SOLN
15.0000 mL | Freq: Once | INTRAVENOUS | Status: AC | PRN
  Administered 2017-04-09: 15 mL via INTRAVENOUS

## 2017-05-20 DIAGNOSIS — M545 Low back pain, unspecified: Secondary | ICD-10-CM | POA: Insufficient documentation

## 2017-05-22 ENCOUNTER — Other Ambulatory Visit: Payer: Self-pay | Admitting: Orthopedic Surgery

## 2017-05-22 DIAGNOSIS — G8929 Other chronic pain: Secondary | ICD-10-CM

## 2017-05-22 DIAGNOSIS — M545 Low back pain: Principal | ICD-10-CM

## 2017-06-05 ENCOUNTER — Ambulatory Visit
Admission: RE | Admit: 2017-06-05 | Discharge: 2017-06-05 | Disposition: A | Discharge status: Home / Self Care | Payer: No Typology Code available for payment source | Source: Ambulatory Visit | Attending: Orthopedic Surgery | Admitting: Orthopedic Surgery

## 2017-06-05 DIAGNOSIS — G8929 Other chronic pain: Secondary | ICD-10-CM

## 2017-06-05 DIAGNOSIS — M545 Low back pain: Principal | ICD-10-CM

## 2017-06-05 MED ORDER — DIAZEPAM 5 MG PO TABS
10.0000 mg | ORAL_TABLET | Freq: Once | ORAL | Status: AC
  Administered 2017-06-05: 10 mg via ORAL

## 2017-06-05 MED ORDER — IOPAMIDOL (ISOVUE-M 200) INJECTION 41%
15.0000 mL | Freq: Once | INTRAMUSCULAR | Status: AC
  Administered 2017-06-05: 15 mL via INTRATHECAL

## 2017-06-05 NOTE — Discharge Instructions (Signed)
Myelogram Discharge Instructions  1. Go home and rest quietly for the next 24 hours.  It is important to lie flat for the next 24 hours.  Get up only to go to the restroom.  You may lie in the bed or on a couch on your back, your stomach, your left side or your right side.  You may have one pillow under your head.  You may have pillows between your knees while you are on your side or under your knees while you are on your back.  2. DO NOT drive today.  Recline the seat as far back as it will go, while still wearing your seat belt, on the way home.  3. You may get up to go to the bathroom as needed.  You may sit up for 10 minutes to eat.  You may resume your normal diet and medications unless otherwise indicated.  Drink plenty of extra fluids today and tomorrow.  4. The incidence of a spinal headache with nausea and/or vomiting is about 5% (one in 20 patients).  If you develop a headache, lie flat and drink plenty of fluids until the headache goes away.  Caffeinated beverages may be helpful.  If you develop severe nausea and vomiting or a headache that does not go away with flat bed rest, call [phone] [facility]) or [phone] Sharyn Lull and [place], the nurses).  5. You may resume normal activities after your 24 hours of bed rest is over; however, do not exert yourself strongly or do any heavy lifting tomorrow.  6. Call your physician for a follow-up appointment.

## 2017-07-23 ENCOUNTER — Other Ambulatory Visit: Payer: Self-pay | Admitting: Physician Assistant

## 2017-07-23 DIAGNOSIS — Z1231 Encounter for screening mammogram for malignant neoplasm of breast: Secondary | ICD-10-CM

## 2017-08-12 ENCOUNTER — Ambulatory Visit
Admission: RE | Admit: 2017-08-12 | Discharge: 2017-08-12 | Disposition: A | Discharge status: Home / Self Care | Payer: No Typology Code available for payment source | Source: Ambulatory Visit | Attending: Physician Assistant | Admitting: Physician Assistant

## 2017-08-12 DIAGNOSIS — Z1231 Encounter for screening mammogram for malignant neoplasm of breast: Secondary | ICD-10-CM | POA: Diagnosis not present

## 2018-07-10 ENCOUNTER — Other Ambulatory Visit: Payer: Self-pay | Admitting: Physician Assistant

## 2018-07-10 DIAGNOSIS — Z1231 Encounter for screening mammogram for malignant neoplasm of breast: Secondary | ICD-10-CM

## 2018-12-18 ENCOUNTER — Ambulatory Visit
Admission: RE | Admit: 2018-12-18 | Discharge: 2018-12-18 | Disposition: A | Discharge status: Home / Self Care | Payer: BLUE CROSS/BLUE SHIELD | Source: Ambulatory Visit | Attending: Physician Assistant | Admitting: Physician Assistant

## 2018-12-18 DIAGNOSIS — Z1231 Encounter for screening mammogram for malignant neoplasm of breast: Secondary | ICD-10-CM

## 2019-07-15 ENCOUNTER — Other Ambulatory Visit: Payer: Self-pay | Admitting: Physician Assistant

## 2019-07-15 DIAGNOSIS — N644 Mastodynia: Secondary | ICD-10-CM

## 2019-07-23 ENCOUNTER — Ambulatory Visit
Admission: RE | Admit: 2019-07-23 | Discharge: 2019-07-23 | Disposition: A | Discharge status: Home / Self Care | Payer: 59 | Source: Ambulatory Visit | Attending: Physician Assistant | Admitting: Physician Assistant

## 2019-07-23 DIAGNOSIS — N644 Mastodynia: Secondary | ICD-10-CM | POA: Insufficient documentation

## 2019-07-27 ENCOUNTER — Other Ambulatory Visit: Payer: Self-pay | Admitting: Physician Assistant

## 2019-07-27 DIAGNOSIS — N6489 Other specified disorders of breast: Secondary | ICD-10-CM

## 2019-10-23 ENCOUNTER — Other Ambulatory Visit: Payer: Self-pay

## 2019-10-23 ENCOUNTER — Emergency Department
Admission: EM | Admit: 2019-10-23 | Discharge: 2019-10-23 | Disposition: A | Discharge status: Home / Self Care | Payer: 59 | Attending: Emergency Medicine | Admitting: Emergency Medicine

## 2019-10-23 DIAGNOSIS — N8110 Cystocele, unspecified: Secondary | ICD-10-CM

## 2019-10-23 DIAGNOSIS — N819 Female genital prolapse, unspecified: Secondary | ICD-10-CM | POA: Insufficient documentation

## 2019-10-23 DIAGNOSIS — R102 Pelvic and perineal pain: Secondary | ICD-10-CM | POA: Diagnosis present

## 2019-10-23 LAB — URINALYSIS, COMPLETE (UACMP) WITH MICROSCOPIC
Bacteria, UA: NONE SEEN
Bilirubin Urine: NEGATIVE
Glucose, UA: NEGATIVE mg/dL
Ketones, ur: NEGATIVE mg/dL
Leukocytes,Ua: NEGATIVE
Nitrite: NEGATIVE
Protein, ur: NEGATIVE mg/dL
Specific Gravity, Urine: 1.01 (ref 1.005–1.030)
pH: 8 (ref 5.0–8.0)

## 2019-10-23 LAB — WET PREP, GENITAL
Clue Cells Wet Prep HPF POC: NONE SEEN
Sperm: NONE SEEN
Trich, Wet Prep: NONE SEEN
Yeast Wet Prep HPF POC: NONE SEEN

## 2019-10-23 LAB — GLUCOSE, CAPILLARY: Glucose-Capillary: 121 mg/dL — ABNORMAL HIGH (ref 70–99)

## 2019-10-23 NOTE — ED Provider Notes (Signed)
Emergency Department Provider Note  ____________________________________________  Time seen: Approximately 10:49 PM  I have reviewed the triage vital signs and the nursing notes.   HISTORY  Chief Complaint Vaginal Pain   Historian Patient     HPI Erica Peters is a 64 y.o. female presents to the emergency department with concern for dysuria and a sense of pressure along the vaginal introitus.  Patient states that she feels a "bump".  She states that she experiences sensation when she has prolonged standing such as in the shower.  She has had 5 children.  Her gynecologist has suggested in the past that she might have a pelvic prolapse.  No hematuria or low back pain.  Patient denies pelvic pain or changes in vaginal discharge.  Patient is concern for UTI.   History reviewed. No pertinent past medical history.   Immunizations up to date:  Yes.     History reviewed. No pertinent past medical history.  There are no problems to display for this patient.     Prior to Admission medications   Not on File    Allergies Patient has no known allergies.  History reviewed. No pertinent family history.  Social History Social History   Tobacco Use  . Smoking status: Not on file  Substance Use Topics  . Alcohol use: Not on file  . Drug use: Not on file     Review of Systems  Constitutional: No fever/chills Eyes:  No discharge ENT: No upper respiratory complaints. Respiratory: no cough. No SOB/ use of accessory muscles to breath Gastrointestinal:   No nausea, no vomiting.  No diarrhea.  No constipation. Musculoskeletal: Negative for musculoskeletal pain. Skin: Negative for rash, abrasions, lacerations, ecchymosis.    ____________________________________________   PHYSICAL EXAM:  VITAL SIGNS: ED Triage Vitals  Enc Vitals Group     BP 10/23/19 1905 (!) 179/85     Pulse Rate 10/23/19 1906 81     Resp 10/23/19 1905 18     Temp 10/23/19 1906 97.9  F (36.6 C)     Temp Source 10/23/19 1906 Oral     SpO2 10/23/19 1905 99 %     Weight 10/23/19 1904 150 lb (68 kg)     Height 10/23/19 1904 4\' 9"  (1.448 m)     Head Circumference --      Peak Flow --      Pain Score 10/23/19 1902 8     Pain Loc --      Pain Edu? --      Excl. in [place]? --      Constitutional: Alert and oriented. Well appearing and in no acute distress. Eyes: Conjunctivae are normal. PERRL. EOMI. Head: Atraumatic. Cardiovascular: Normal rate, regular rhythm. Normal S1 and S2.  Good peripheral circulation. Respiratory: Normal respiratory effort without tachypnea or retractions. Lungs CTAB. Good air entry to the bases with no decreased or absent breath sounds Gastrointestinal: Bowel sounds x 4 quadrants. Soft and nontender to palpation. No guarding or rigidity. No distention. Genitourinary: Patient has palpable pelvic organ at vaginal introitus. Musculoskeletal: Full range of motion to all extremities. No obvious deformities noted Neurologic:  Normal for age. No gross focal neurologic deficits are appreciated.  Skin:  Skin is warm, dry and intact. No rash noted. Psychiatric: Mood and affect are normal for age. Speech and behavior are normal.   ____________________________________________   LABS (all labs ordered are listed, but only abnormal results are displayed)  Labs Reviewed  WET PREP, GENITAL - Abnormal; Notable  for the following components:      Result Value   WBC, Wet Prep HPF POC MODERATE (*)    All other components within normal limits  GLUCOSE, CAPILLARY - Abnormal; Notable for the following components:   Glucose-Capillary 121 (*)    All other components within normal limits  URINALYSIS, COMPLETE (UACMP) WITH MICROSCOPIC - Abnormal; Notable for the following components:   Color, Urine STRAW (*)    APPearance CLEAR (*)    Hgb urine dipstick SMALL (*)    All other components within normal limits    ____________________________________________  EKG   ____________________________________________  RADIOLOGY   No results found.  ____________________________________________    PROCEDURES  Procedure(s) performed:     Procedures     Medications - No data to display   ____________________________________________   INITIAL IMPRESSION / ASSESSMENT AND PLAN / ED COURSE  Pertinent labs & imaging results that were available during my care of the patient were reviewed by me and considered in my medical decision making (see chart for details).      Assessment and plan:  Vaginal prolapse 64 year old female presents to the emergency department with dysuria and a sensation of pressure at the vaginal introitus.  Patient was hypertensive at triage and may that she had not taken her blood pressure medications today.  Patient had a vaginal prolapse on physical exam.  Urinalysis revealed a small amount of blood but no other findings concerning for cystitis.  No evidence of BV or yeast on wet prep.  Patient was advised to follow-up with OB/GYN.  Return precautions were given to return with new or worsening symptoms.     ____________________________________________  FINAL CLINICAL IMPRESSION(S) / ED DIAGNOSES  Final diagnoses:  Vaginal prolapse      NEW MEDICATIONS STARTED DURING THIS VISIT:  ED Discharge Orders    None          This chart was dictated using voice recognition software/Dragon. Despite best efforts to proofread, errors can occur which can change the meaning. Any change was purely unintentional.     Lannie Fields, PA-C 10/23/19 2255    Arta Silence, MD 10/23/19 [phone]

## 2019-10-23 NOTE — ED Notes (Signed)
First Nurse Note: Pt to ED for vaginal pain and lump. Pt is in NAD.

## 2019-10-23 NOTE — ED Notes (Signed)
Through video interpreter, patient was instructed how to obtain a clean catch urine and to undress for a pelvic exam.

## 2019-10-23 NOTE — Discharge Instructions (Signed)
Please make follow-up with your gynecologist.

## 2019-10-23 NOTE — ED Triage Notes (Addendum)
Interpreter used for triage. Pt comes POV from home for lower back pain and UTI symptoms. Pt also reports a small lump at the bottom of her vaginal area that her PCP says is a prolapsed bladder. Pt has an appointment with PCP on Wednesday but reports increased use of bathroom.

## 2019-10-23 NOTE — ED Notes (Signed)
Video Spanish interpreter is present for discharge instructions.

## 2019-10-23 NOTE — ED Notes (Signed)
Interpreter requested 

## 2019-10-23 NOTE — ED Notes (Signed)
Spanish interpreter Estill Bamberg at bedside for pelvic exam.

## 2019-11-04 ENCOUNTER — Ambulatory Visit (INDEPENDENT_AMBULATORY_CARE_PROVIDER_SITE_OTHER): Payer: 59 | Admitting: Obstetrics and Gynecology

## 2019-11-04 ENCOUNTER — Encounter: Payer: Self-pay | Admitting: Obstetrics and Gynecology

## 2019-11-04 ENCOUNTER — Other Ambulatory Visit: Payer: Self-pay

## 2019-11-04 VITALS — BP 164/82 | HR 76 | Wt 150.0 lb

## 2019-11-04 DIAGNOSIS — N8111 Cystocele, midline: Secondary | ICD-10-CM

## 2019-11-04 DIAGNOSIS — N993 Prolapse of vaginal vault after hysterectomy: Secondary | ICD-10-CM

## 2019-11-04 DIAGNOSIS — N816 Rectocele: Secondary | ICD-10-CM | POA: Diagnosis not present

## 2019-11-04 NOTE — Progress Notes (Signed)
Obstetrics & Gynecology Office Visit   Chief Complaint  Patient presents with  . Vaginal Prolapse    ER follow up, vaginal pain/pressure   History of Present Illness: 64 y.o. female here for ER follow up for symptoms of pelvic organ prolapse.  She is a postmenopausal female  with the above chief complaint and personal goals.    She had 5 children born vaginally and 0 by c-section.  Largest baby weighed 10 lbs 0 oz.   She went through spontaneous at an undetermined age. She had a hysterectomy 30 years ago, but retained her ovaries. She states she never had hot flashes.   Her current symptoms began 2  years ago.  By pelvic floor systems, these include the following:  Urinary symptoms include:    She does endorse urinary leakage, voiding difficulties, hematuria, or UTIs.   If yes to above these symptoms include the following:   Her urinary leakage is associated with: positive Cough, sneeze, lifting, laughter, exercise, walking positive Uncontrollable urge positive Frequency - lots of  times per day negative Insensible urine loss without knowledge positive Post void dribbling Leakage occurs rarely She wears/changes 2 pads per day - liner  Patient consumes 0 caffeinated beverages a day and approximately 5 total glasses of water per day. She experiences nocturia 3 times per night Nocturia enuresis occurred once about 2 weeks ago She does not endorses voiding difficulties including:    She does report a history of UTIs  Unsure times in the last year She does not have a history hematuria: Past treatments included:  Behavioral/Kegels/Physical Therapy  NO  Medications  NO  Urodynamics NO  Cystoscopy NO  Surgery NO  Pelvic organ prolapse symptoms include: She does endorse symptoms of pelvic organ prolapse.  If yes to the above these symptoms include the following:  She notices a vaginal bulge at the level of the vaginal opening She describes this as the size as a walnut      She  does reports vaginal pressure She does not reduce the bulge in order to urinate  defecate positive The bulge is uncomfortable with sexual function - she has pain in her lower abdomen with intercourse.  Prior treatments include:    Pessary NO  Surgery NO  Colorectal symptoms include: She does endorse constipation. Denies hematochezia and melena.     If yes to the above then these symptoms include the following:  Constipation  She reports having a BM every 1 days To manage her constipation symptoms she uses:  Prunes She does not have a history of IBS She does not have a history of IBD  She does not have a history of radiation  Health Care Maintenance: Last colorectal cancer screening: yes.  This occurred about 6 years ago.   Last mammogram: 4 months.  Recommend follow up in 12/2019 Last pap smear: unsure    Past Medical History:  Diagnosis Date  . Anemia   . Hypertension     Past Surgical History:  Procedure Laterality Date  . ABDOMINAL HYSTERECTOMY    . BREAST BIOPSY Left 2002   neg  . BREAST CYST ASPIRATION Left 2000   neg  . FLAT FOOT RECONSTRUCTION-TAL GASTROC RECESSION Right 01/26/2016   Procedure: FLAT FOOT RECONSTRUCTION-TAL GASTROC RECESSION;  Surgeon: Samara Deist, DPM;  Location: [facility];  Service: Podiatry;  Laterality: Right;  . FOOT ARTHRODESIS Right 01/26/2016   Procedure: ARTHRODESIS FOOT;  Surgeon: Samara Deist, DPM;  Location: [facility];  Service: Podiatry;  Laterality: Right;  . HEMORRHOID SURGERY    . TRIPLE SUBTALAR FUSION Right 01/26/2016   Procedure: TRIPLE SUBTALAR FUSION;  Surgeon: Samara Deist, DPM;  Location: [facility];  Service: Podiatry;  Laterality: Right;  Marland Kitchen VARICOSE VEIN SURGERY      Gynecologic History: No LMP recorded. Patient has had a hysterectomy.  Family History  Problem Relation Age of Onset  . Breast cancer Other     Social History   Socioeconomic History  . Marital status: Married    Spouse name: Not on file  .  Number of children: Not on file  . Years of education: Not on file  . Highest education level: Not on file  Occupational History  . Not on file  Tobacco Use  . Smoking status: Never Smoker  . Smokeless tobacco: Never Used  Substance and Sexual Activity  . Alcohol use: No  . Drug use: No  . Sexual activity: Not on file  Other Topics Concern  . Not on file  Social History Narrative   ** Merged History Encounter **       Social Determinants of Health   Financial Resource Strain:   . Difficulty of Paying Living Expenses:   Food Insecurity:   . Worried About Charity fundraiser in the Last Year:   . Arboriculturist in the Last Year:   Transportation Needs:   . Film/video editor (Medical):   Marland Kitchen Lack of Transportation (Non-Medical):   Physical Activity:   . Days of Exercise per Week:   . Minutes of Exercise per Session:   Stress:   . Feeling of Stress :   Social Connections:   . Frequency of Communication with Friends and Family:   . Frequency of Social Gatherings with Friends and Family:   . Attends Religious Services:   . Active Member of Clubs or Organizations:   . Attends Archivist Meetings:   Marland Kitchen Marital Status:   Intimate Partner Violence:   . Fear of Current or Ex-Partner:   . Emotionally Abused:   Marland Kitchen Physically Abused:   . Sexually Abused:     No Known Allergies  Prior to Admission medications   Medication Sig Start Date End Date Taking? Authorizing Provider  amLODipine (NORVASC) 5 MG tablet Take 5 mg by mouth daily.   Yes [provider]  ferrous sulfate 325 (65 FE) MG tablet Take 325 mg by mouth daily with breakfast.   Yes [provider]  gabapentin (NEURONTIN) 100 MG capsule Take 100 mg by mouth once.   Yes [provider]  Misc Natural Products (OSTEO BI-FLEX JOINT SHIELD PO) Take 1 tablet by mouth daily.   Yes [provider]  oxyCODONE-acetaminophen (ROXICET) 5-325 MG tablet Take 1-2 tablets by mouth every  4 (four) hours as needed for severe pain. 01/26/16  Yes Samara Deist, DPM  pantoprazole (PROTONIX) 40 MG tablet Take 40 mg by mouth daily.   Yes [provider]  Sennosides (PRUNE SENNA CONCENTRATE PO) Take 1 tablet by mouth daily as needed. May increase as necessary   Yes [provider]    Review of Systems  Constitutional: Negative.   HENT: Negative.   Eyes: Negative.   Respiratory: Negative.   Cardiovascular: Negative.   Gastrointestinal: Negative.   Genitourinary: Negative.   Musculoskeletal: Negative.   Skin: Negative.   Neurological: Negative.   Psychiatric/Behavioral: Negative.      Physical Exam BP (!) 164/82   Pulse 76   Wt  150 lb (68 kg)   BMI 32.46 kg/m  No LMP recorded. Patient has had a hysterectomy. Physical Exam Constitutional:      General: She is not in acute distress.    Appearance: Normal appearance. She is well-developed.  Genitourinary:     Pelvic exam was performed with patient in the lithotomy position.     Vulva, inguinal canal, urethra, bladder, vagina, uterus, right adnexa and left adnexa normal.     No posterior fourchette tenderness, injury or lesion present.     No cervical friability, lesion, bleeding or polyp.  HENT:     Head: Normocephalic and atraumatic.  Eyes:     General: No scleral icterus.    Conjunctiva/sclera: Conjunctivae normal.  Cardiovascular:     Rate and Rhythm: Normal rate and regular rhythm.     Heart sounds: No murmur heard.  No friction rub. No gallop.   Pulmonary:     Effort: Pulmonary effort is normal. No respiratory distress.     Breath sounds: Normal breath sounds. No wheezing or rales.  Abdominal:     General: Bowel sounds are normal. There is no distension.     Palpations: Abdomen is soft. There is no mass.     Tenderness: There is no abdominal tenderness. There is no guarding or rebound.  Musculoskeletal:        General: Normal range of motion.     Cervical back: Normal range of motion and  neck supple.  Neurological:     General: No focal deficit present.     Mental Status: She is alert and oriented to person, place, and time.     Cranial Nerves: No cranial nerve deficit.  Skin:    General: Skin is warm and dry.     Findings: No erythema.  Psychiatric:        Mood and Affect: Mood normal.        Behavior: Behavior normal.        Judgment: Judgment normal.   Pelvic exam (more detail):  She had preserved perineal sensation and reflexes.     negative supine stress test       EG/BUS/vaginal:  atrophic (or) normal estrogen effect, no lesions or discharge Cervix:   absent Uterus:  absent Adnexae:  No masses, no fullness, no tenderness Anorectum: No Hemorrhoids Anal Sphincter tone: 2  Pelvic floor muscle strength (Kegel) =     1 out of 4  POP-Q exam:      Aa = 0 Ba = 0 C = X  gH = 5 pb = 5 TVL = 8  Ap = +1 BP = +1 D = -1   Summary statement of POP-Q exam:    the anterior vaginal wall is at the level of the hymen the cuff / cervix is 1 cm inside the level of the hymen,  and the posterior vaginal wall is 1 cm outside the level of the hymen  Patient voided:  Not done  PVR = not done   Female chaperone present for pelvic and breast  portions of the physical exam  Assessment: 64 y.o. No obstetric history on file. female here for  1. Rectocele   2. Cystocele, midline   3. 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 After discussing the benefits/risks and pros/cons of each, she would like to proceed with surgery. Given her apical prolapse, I believe she would best be served by a urogynecologist who might offer sacral colpopexy in addition  to anterior/posterior colporrhaphy and possibly perineorrhaphy.  Since I do not perform sacral colpopexy, I will refer her to a urogynecologist.  I discussed that I could perform an anterior and posterior repair. However, her apical defect would not be optimally repaired. She agreed with referral.  A hospital-provided spanish language interpreter was present throughout the visit.   A total of 60 minutes were spent face-to-face with the patient as well as preparation, review, communication, and documentation during this encounter.    Prentice Docker, MD 11/05/2019 1:20 PM

## 2019-11-05 ENCOUNTER — Encounter: Payer: Self-pay | Admitting: Obstetrics and Gynecology

## 2019-11-23 ENCOUNTER — Other Ambulatory Visit: Payer: Self-pay

## 2019-11-23 ENCOUNTER — Emergency Department: Payer: 59

## 2019-11-23 ENCOUNTER — Emergency Department
Admission: EM | Admit: 2019-11-23 | Discharge: 2019-11-23 | Disposition: A | Discharge status: Home / Self Care | Payer: 59 | Attending: Emergency Medicine | Admitting: Emergency Medicine

## 2019-11-23 DIAGNOSIS — R700 Elevated erythrocyte sedimentation rate: Secondary | ICD-10-CM | POA: Diagnosis not present

## 2019-11-23 DIAGNOSIS — R519 Headache, unspecified: Secondary | ICD-10-CM

## 2019-11-23 DIAGNOSIS — Z20822 Contact with and (suspected) exposure to covid-19: Secondary | ICD-10-CM | POA: Diagnosis not present

## 2019-11-23 DIAGNOSIS — I10 Essential (primary) hypertension: Secondary | ICD-10-CM | POA: Diagnosis not present

## 2019-11-23 LAB — CBC WITH DIFFERENTIAL/PLATELET
Abs Immature Granulocytes: 0.04 10*3/uL (ref 0.00–0.07)
Basophils Absolute: 0.1 10*3/uL (ref 0.0–0.1)
Basophils Relative: 1 %
Eosinophils Absolute: 0 10*3/uL (ref 0.0–0.5)
Eosinophils Relative: 0 %
HCT: 43.2 % (ref 36.0–46.0)
Hemoglobin: 14.5 g/dL (ref 12.0–15.0)
Immature Granulocytes: 0 %
Lymphocytes Relative: 16 %
Lymphs Abs: 1.6 10*3/uL (ref 0.7–4.0)
MCH: 30.2 pg (ref 26.0–34.0)
MCHC: 33.6 g/dL (ref 30.0–36.0)
MCV: 90 fL (ref 80.0–100.0)
Monocytes Absolute: 0.3 10*3/uL (ref 0.1–1.0)
Monocytes Relative: 3 %
Neutro Abs: 8.2 10*3/uL — ABNORMAL HIGH (ref 1.7–7.7)
Neutrophils Relative %: 80 %
Platelets: 266 10*3/uL (ref 150–400)
RBC: 4.8 MIL/uL (ref 3.87–5.11)
RDW: 13.6 % (ref 11.5–15.5)
WBC: 10.3 10*3/uL (ref 4.0–10.5)
nRBC: 0 % (ref 0.0–0.2)

## 2019-11-23 LAB — COMPREHENSIVE METABOLIC PANEL
ALT: 18 U/L (ref 0–44)
AST: 21 U/L (ref 15–41)
Albumin: 4.5 g/dL (ref 3.5–5.0)
Alkaline Phosphatase: 72 U/L (ref 38–126)
Anion gap: 13 (ref 5–15)
BUN: 13 mg/dL (ref 8–23)
CO2: 23 mmol/L (ref 22–32)
Calcium: 8.9 mg/dL (ref 8.9–10.3)
Chloride: 105 mmol/L (ref 98–111)
Creatinine, Ser: 0.54 mg/dL (ref 0.44–1.00)
GFR calc Af Amer: >60 mL/min (ref >60)
GFR calc non Af Amer: >60 mL/min (ref >60)
Glucose, Bld: 98 mg/dL (ref 70–99)
Potassium: 4.1 mmol/L (ref 3.5–5.1)
Sodium: 141 mmol/L (ref 135–145)
Total Bilirubin: 0.7 mg/dL (ref 0.3–1.2)
Total Protein: 7.9 g/dL (ref 6.5–8.1)

## 2019-11-23 LAB — SARS CORONAVIRUS 2 BY RT PCR (HOSPITAL ORDER, PERFORMED IN ~~LOC~~ HOSPITAL LAB): SARS Coronavirus 2: NEGATIVE

## 2019-11-23 LAB — C-REACTIVE PROTEIN: CRP: 0.6 mg/dL (ref <1.0)

## 2019-11-23 LAB — SEDIMENTATION RATE: Sed Rate: 43 mm/hr — ABNORMAL HIGH (ref 0–30)

## 2019-11-23 MED ORDER — KETOROLAC TROMETHAMINE 30 MG/ML IJ SOLN
30.0000 mg | Freq: Once | INTRAMUSCULAR | Status: AC
  Administered 2019-11-23: 30 mg via INTRAVENOUS
  Filled 2019-11-23: qty 1

## 2019-11-23 MED ORDER — METHYLPREDNISOLONE SODIUM SUCC 125 MG IJ SOLR
125.0000 mg | Freq: Once | INTRAMUSCULAR | Status: AC
  Administered 2019-11-23: 125 mg via INTRAVENOUS
  Filled 2019-11-23: qty 2

## 2019-11-23 MED ORDER — SODIUM CHLORIDE 0.9 % IV BOLUS
1000.0000 mL | Freq: Once | INTRAVENOUS | Status: AC
  Administered 2019-11-23: 1000 mL via INTRAVENOUS

## 2019-11-23 MED ORDER — TRAMADOL HCL 50 MG PO TABS: 50.0000 mg | ORAL_TABLET | Freq: Four times a day (QID) | ORAL | 0 refills | Status: DC | PRN

## 2019-11-23 MED ORDER — OXYCODONE-ACETAMINOPHEN 5-325 MG PO TABS
1.0000 | ORAL_TABLET | Freq: Once | ORAL | Status: AC
  Administered 2019-11-23: 1 via ORAL
  Filled 2019-11-23: qty 1

## 2019-11-23 MED ORDER — DIPHENHYDRAMINE HCL 50 MG/ML IJ SOLN
25.0000 mg | Freq: Once | INTRAMUSCULAR | Status: AC
  Administered 2019-11-23: 25 mg via INTRAVENOUS
  Filled 2019-11-23: qty 1

## 2019-11-23 MED ORDER — ONDANSETRON HCL 4 MG/2ML IJ SOLN
4.0000 mg | Freq: Once | INTRAMUSCULAR | Status: AC
  Administered 2019-11-23: 4 mg via INTRAVENOUS
  Filled 2019-11-23: qty 2

## 2019-11-23 MED ORDER — PREDNISONE 10 MG PO TABS: ORAL_TABLET | ORAL | 0 refills | Status: DC

## 2019-11-23 NOTE — ED Notes (Signed)
See triage note   States she developed a h/a last pm  States pain is at left temporal area and behind left eye  No fever and no vision changes  States h/a is like a "throbbing' pain

## 2019-11-23 NOTE — ED Provider Notes (Signed)
[facility] Emergency Department Provider Note  ____________________________________________   First MD Initiated Contact with Patient 11/23/19 1044     (approximate)  I have reviewed the triage vital signs and the nursing notes.   HISTORY  Chief Complaint Headache    HPI Erica Peters is a 64 y.o. female presents emergency department complaint of a severe headache.  No history of headaches.  States never happened her before.  Did take Tylenol which helped a little bit but it returned and has been very sharp and stabbing into the left temple.  She denies any fever or chills.  No Covid exposure.  No slurred speech or weakness.  Did have some numbness and tingling into the left arm earlier in the week.  No rash.   Pain is rated 8/10   Past Medical History:  Diagnosis Date  . Anemia   . Hypertension     There are no problems to display for this patient.   Past Surgical History:  Procedure Laterality Date  . ABDOMINAL HYSTERECTOMY    . BREAST BIOPSY Left 2002   neg  . BREAST CYST ASPIRATION Left 2000   neg  . FLAT FOOT RECONSTRUCTION-TAL GASTROC RECESSION Right 01/26/2016   Procedure: FLAT FOOT RECONSTRUCTION-TAL GASTROC RECESSION;  Surgeon: Samara Deist, DPM;  Location: [facility] ORS;  Service: Podiatry;  Laterality: Right;  . FOOT ARTHRODESIS Right 01/26/2016   Procedure: ARTHRODESIS FOOT;  Surgeon: Samara Deist, DPM;  Location: [facility] ORS;  Service: Podiatry;  Laterality: Right;  . HEMORRHOID SURGERY    . TRIPLE SUBTALAR FUSION Right 01/26/2016   Procedure: TRIPLE SUBTALAR FUSION;  Surgeon: Samara Deist, DPM;  Location: [facility] ORS;  Service: Podiatry;  Laterality: Right;  Marland Kitchen VARICOSE VEIN SURGERY      Prior to Admission medications   Medication Sig Start Date End Date Taking? Authorizing Provider  amLODipine (NORVASC) 5 MG tablet Take 5 mg by mouth daily.    [provider]  ferrous sulfate 325 (65 FE) MG tablet Take 325 mg by mouth  daily with breakfast.    [provider]  gabapentin (NEURONTIN) 100 MG capsule Take 100 mg by mouth once.    [provider]  Misc Natural Products (OSTEO BI-FLEX JOINT SHIELD PO) Take 1 tablet by mouth daily.    [provider]  oxyCODONE-acetaminophen (ROXICET) 5-325 MG tablet Take 1-2 tablets by mouth every 4 (four) hours as needed for severe pain. 01/26/16   Samara Deist, DPM  pantoprazole (PROTONIX) 40 MG tablet Take 40 mg by mouth daily.    [provider]  predniSONE (DELTASONE) 10 MG tablet Take 6 pills daily for 5 days, then 30 pills daily for 5 days 11/23/19   Caryn Section, Linden Dolin, PA-C  Sennosides (PRUNE SENNA CONCENTRATE PO) Take 1 tablet by mouth daily as needed. May increase as necessary    [provider]  traMADol (ULTRAM) 50 MG tablet Take 1 tablet (50 mg total) by mouth every 6 (six) hours as needed. 11/23/19   Versie Starks, PA-C    Allergies Patient has no known allergies.  Family History  Problem Relation Age of Onset  . Breast cancer Other     Social History Social History   Tobacco Use  . Smoking status: Never Smoker  . Smokeless tobacco: Never Used  Substance Use Topics  . Alcohol use: No  . Drug use: No    Review of Systems  Constitutional: No fever/chills, positive headache Eyes: No visual changes. ENT: No sore  throat. Respiratory: Denies cough Cardiovascular: Denies chest pain Gastrointestinal: Denies abdominal pain Genitourinary: Negative for dysuria. Musculoskeletal: Negative for back pain. Skin: Negative for rash. Psychiatric: no mood changes,     ____________________________________________   PHYSICAL EXAM:  VITAL SIGNS: ED Triage Vitals  Enc Vitals Group     BP 11/23/19 0954 (!) 179/89     Pulse Rate 11/23/19 0954 95     Resp 11/23/19 0954 17     Temp 11/23/19 0954 98.2 F (36.8 C)     Temp Source 11/23/19 0954 Oral     SpO2 11/23/19 0954 99 %     Weight 11/23/19 0955 152 lb (68.9 kg)      Height 11/23/19 0955 4\' 11"  (1.499 m)     Head Circumference --      Peak Flow --      Pain Score 11/23/19 0954 8     Pain Loc --      Pain Edu? --      Excl. in [place]? --     Constitutional: Alert and oriented. Well appearing and in no acute distress. Eyes: Conjunctivae are normal.  Head: Left temple is very tender to palpation Nose: No congestion/rhinnorhea. Mouth/Throat: Mucous membranes are moist.   Neck:  supple no lymphadenopathy noted Cardiovascular: Normal rate, regular rhythm. Heart sounds are normal Respiratory: Normal respiratory effort.  No retractions, lungs c t a  GU: deferred Musculoskeletal: FROM all extremities, warm and well perfused Neurologic:  Normal speech and language.  Skin:  Skin is warm, dry and intact. No rash noted. Psychiatric: Mood and affect are normal. Speech and behavior are normal.  ____________________________________________   LABS (all labs ordered are listed, but only abnormal results are displayed)  Labs Reviewed  CBC WITH DIFFERENTIAL/PLATELET - Abnormal; Notable for the following components:      Result Value   Neutro Abs 8.2 (*)    All other components within normal limits  SEDIMENTATION RATE - Abnormal; Notable for the following components:   Sed Rate 43 (*)    All other components within normal limits  SARS CORONAVIRUS 2 BY RT PCR (HOSPITAL ORDER, [place] [facility])  COMPREHENSIVE METABOLIC PANEL  C-REACTIVE PROTEIN   ____________________________________________   ____________________________________________  RADIOLOGY  CT of the head is negative  ____________________________________________   PROCEDURES  Procedure(s) performed: No  Procedures    ____________________________________________   INITIAL IMPRESSION / ASSESSMENT AND PLAN / ED COURSE  Pertinent labs & imaging results that were available during my care of the patient were reviewed by me and considered in my medical decision making  (see chart for details).   Patient is a 64 year old female presents with sudden onset of headache 2 days ago.  No change in her vision.  Patient states it is a pulsing type pain at the left temple.  She denies fever or chills.  Physical exam shows the left temple be very tender to palpation.  DDx: Temporal arteritis, intracranial hemorrhage, Covid, migraine  CBC is normal, comprehensive metabolic panel is normal, Covid is negative, sed rate is elevated at 43, C-reactive protein is still pending  The patient was given Toradol, Benadryl, Zofran and 1 L normal saline which helped with the pain but she continues to have a on and off pulsing pain.  We did give her Solu-Medrol 125 mg IV due to the elevated sed rate and concerns of temporal arteritis.  Did explain all findings to the patient.  With interpreter.  She was given a prescription  for prednisone 60 mg for 5 days and then 30 mg for an additional 5 days.  She is to call Dr. Nino Parsley office for a follow-up appointment.  Secure message was sent to Dr. Delana Meyer with the patient's chart attached indicating that she would need an appointment to rule out temporal arteritis.  She is to return to the emergency department if worsening.  She states she understands.  She discharged stable condition.    Erica Peters was evaluated in Emergency Department on 11/23/2019 for the symptoms described in the history of present illness. She was evaluated in the context of the global COVID-19 pandemic, which necessitated consideration that the patient might be at risk for infection with the SARS-CoV-2 virus that causes COVID-19. Institutional protocols and algorithms that pertain to the evaluation of patients at risk for COVID-19 are in a state of rapid change based on information released by regulatory bodies including the CDC and federal and state organizations. These policies and algorithms were followed during the patient's care in the ED.    As part of  my medical decision making, I reviewed the following data within the [place] notes reviewed and incorporated, Interpreter needed, Labs reviewed , Old chart reviewed, Radiograph reviewed , Notes from prior ED visits and [place] Controlled Substance Database  ____________________________________________   FINAL CLINICAL IMPRESSION(S) / ED DIAGNOSES  Final diagnoses:  Bad headache  Elevated sed rate      NEW MEDICATIONS STARTED DURING THIS VISIT:  Discharge Medication List as of 11/23/2019  1:49 PM    START taking these medications   Details  predniSONE (DELTASONE) 10 MG tablet Take 6 pills daily for 5 days, then 30 pills daily for 5 days, Normal    traMADol (ULTRAM) 50 MG tablet Take 1 tablet (50 mg total) by mouth every 6 (six) hours as needed., Starting Tue 11/23/2019, Normal         Note:  This document was prepared using Dragon voice recognition software and may include unintentional dictation errors.    Versie Starks, PA-C 11/23/19 1444    Vladimir Crofts, MD 11/23/19 2110

## 2019-11-23 NOTE — Discharge Instructions (Signed)
Follow-up with Dr. Nino Parsley office, please call for an appointment Take the medication as prescribed Return if worse

## 2019-11-23 NOTE — ED Triage Notes (Signed)
Pt c/o HA since last night, denies any N/V/ or dizziness. States she took 2 tylenol earlier in the night.

## 2019-11-26 ENCOUNTER — Telehealth: Payer: Self-pay

## 2019-11-26 NOTE — Telephone Encounter (Signed)
Pt needs to set up her pre op appointment, please call her daughter ar [phone] her name is Erica Peters also,

## 2019-11-26 NOTE — Telephone Encounter (Signed)
Routing to [place] to schedule

## 2019-12-01 ENCOUNTER — Telehealth: Payer: Self-pay | Admitting: Obstetrics and Gynecology

## 2019-12-01 NOTE — Telephone Encounter (Signed)
Patient's daughter called wanting to schedule pre-op with Glennon Mac. I was unfamiliar with case and that is because SDJ had referred pt to [facility] because of the complexity. Pt daughter adv that [employer] (and also [facility]) doesn't accept her mother's insurance, [employer]. I told daughter that I would forward msg to [facility] for him to adv on how he wants to move forward with pt.  I told daughter I would call her tomorrow, 8/12, to adv.

## 2019-12-02 ENCOUNTER — Ambulatory Visit (INDEPENDENT_AMBULATORY_CARE_PROVIDER_SITE_OTHER): Payer: 59 | Admitting: Vascular Surgery

## 2019-12-02 ENCOUNTER — Encounter (INDEPENDENT_AMBULATORY_CARE_PROVIDER_SITE_OTHER): Payer: Self-pay | Admitting: Vascular Surgery

## 2019-12-02 ENCOUNTER — Other Ambulatory Visit: Payer: Self-pay

## 2019-12-02 VITALS — BP 161/80 | HR 71 | Resp 16 | Wt 146.8 lb

## 2019-12-02 DIAGNOSIS — K219 Gastro-esophageal reflux disease without esophagitis: Secondary | ICD-10-CM

## 2019-12-02 DIAGNOSIS — I10 Essential (primary) hypertension: Secondary | ICD-10-CM | POA: Insufficient documentation

## 2019-12-02 DIAGNOSIS — G4489 Other headache syndrome: Secondary | ICD-10-CM

## 2019-12-02 DIAGNOSIS — R200 Anesthesia of skin: Secondary | ICD-10-CM | POA: Diagnosis not present

## 2019-12-03 NOTE — Telephone Encounter (Signed)
Per our discussion, will get her on with Urogyn at [facility] (first appts in October). She will likely be placed on a waiting list.

## 2019-12-03 NOTE — Telephone Encounter (Signed)
I called the pts daughter back after talking with SDJ to adv that he is referring her to a new (Oct 2021) [facility] UroGYN specialist, Dr Marnee Guarneri.  I adv that I have already forwarded her mother's referral and that Dr. Forde Radon office will contact her to schedule consult.  Daughter was appreciative and will adv her mother.

## 2019-12-05 ENCOUNTER — Encounter (INDEPENDENT_AMBULATORY_CARE_PROVIDER_SITE_OTHER): Payer: Self-pay | Admitting: Vascular Surgery

## 2019-12-05 DIAGNOSIS — R519 Headache, unspecified: Secondary | ICD-10-CM | POA: Insufficient documentation

## 2019-12-05 NOTE — Progress Notes (Signed)
MRN : [id number]  Erica Peters is a 64 y.o. (02/05/1956) female who presents with chief complaint of  Chief Complaint  Patient presents with  . Follow-up    [facility] 1wk post ED  .  History of Present Illness:   Patient is sent for evaluation of headache and the possible need for temporal artery biopsy.  Patient notes the headache is located over the left temple The patient describes the headache as sharp and severe The patient notes the headache is very severe saying that it is always greater than 8 out of 10 on the pain scale. The duration of the headache has been for more than 1 week. The patient notes timing of the headache is almost constant occurring on a daily basis frequently it waxes and wanes and can become quite intense more than once a day. There do not appear to be any aggravating symptoms. The patient notes rest and laying down helps a little Tylenol has not been effective but the Tramadol helps a little more.  She has been on steroids for several days with no effect.  She is also now noting numbness radiating from the base of the neck to the fingers. Patient does not have a history of migraine headaches. The patient notes that the headache is associated with not visual changes  Current Meds  Medication Sig  . amLODipine (NORVASC) 5 MG tablet Take 5 mg by mouth daily.  . ferrous sulfate 325 (65 FE) MG tablet Take 325 mg by mouth daily with breakfast.  . gabapentin (NEURONTIN) 100 MG capsule Take 100 mg by mouth once.  . Misc Natural Products (OSTEO BI-FLEX JOINT SHIELD PO) Take 1 tablet by mouth daily.  . pantoprazole (PROTONIX) 40 MG tablet Take 40 mg by mouth daily.  . predniSONE (DELTASONE) 10 MG tablet Take 6 pills daily for 5 days, then 30 pills daily for 5 days  . Sennosides (PRUNE SENNA CONCENTRATE PO) Take 1 tablet by mouth daily as needed. May increase as necessary  . traMADol (ULTRAM) 50 MG tablet Take 1 tablet (50 mg total) by mouth every 6 (six)  hours as needed.    Past Medical History:  Diagnosis Date  . Anemia   . Hypertension     Past Surgical History:  Procedure Laterality Date  . ABDOMINAL HYSTERECTOMY    . BREAST BIOPSY Left 2002   neg  . BREAST CYST ASPIRATION Left 2000   neg  . FLAT FOOT RECONSTRUCTION-TAL GASTROC RECESSION Right 01/26/2016   Procedure: FLAT FOOT RECONSTRUCTION-TAL GASTROC RECESSION;  Surgeon: Samara Deist, DPM;  Location: [facility] ORS;  Service: Podiatry;  Laterality: Right;  . FOOT ARTHRODESIS Right 01/26/2016   Procedure: ARTHRODESIS FOOT;  Surgeon: Samara Deist, DPM;  Location: [facility] ORS;  Service: Podiatry;  Laterality: Right;  . HEMORRHOID SURGERY    . TRIPLE SUBTALAR FUSION Right 01/26/2016   Procedure: TRIPLE SUBTALAR FUSION;  Surgeon: Samara Deist, DPM;  Location: [facility] ORS;  Service: Podiatry;  Laterality: Right;  Marland Kitchen VARICOSE VEIN SURGERY      Social History Social History   Tobacco Use  . Smoking status: Never Smoker  . Smokeless tobacco: Never Used  Substance Use Topics  . Alcohol use: No  . Drug use: No    Family History Family History  Problem Relation Age of Onset  . Breast cancer Other   No family history of bleeding/clotting disorders, porphyria or autoimmune disease   No Known Allergies   REVIEW OF SYSTEMS (Negative unless checked)  Constitutional: []   Weight loss  [] Fever  [] Chills Cardiac: [] Chest pain   [] Chest pressure   [] Palpitations   [] Shortness of breath when laying flat   [] Shortness of breath with exertion. Vascular:  [] Pain in legs with walking   [] Pain in legs at rest  [] History of DVT   [] Phlebitis   [] Swelling in legs   [] Varicose veins   [] Non-healing ulcers Pulmonary:   [] Uses home oxygen   [] Productive cough   [] Hemoptysis   [] Wheeze  [] COPD   [] Asthma Neurologic:  [] Dizziness   [] Seizures   [] History of stroke   [] History of TIA  [] Aphasia   [] Vissual changes   [x] Weakness or numbness in arm   [] Weakness or numbness in leg Musculoskeletal:   [] Joint  swelling   [] Joint pain   [] Low back pain Hematologic:  [] Easy bruising  [] Easy bleeding   [] Hypercoagulable state   [] Anemic Gastrointestinal:  [] Diarrhea   [] Vomiting  [x] Gastroesophageal reflux/heartburn   [] Difficulty swallowing. Genitourinary:  [] Chronic kidney disease   [] Difficult urination  [] Frequent urination   [] Blood in urine Skin:  [] Rashes   [] Ulcers  Psychological:  [] History of anxiety   []  History of major depression.  Physical Examination  Vitals:   12/02/19 1618  BP: (!) 161/80  Pulse: 71  Resp: 16  Weight: 146 lb 12.8 oz (66.6 kg)   Body mass index is 29.65 kg/m. Gen: WD/WN, NAD Head: [place]/AT, No temporalis wasting.  Ear/Nose/Throat: Hearing grossly intact, nares w/o erythema or drainage, poor dentition Eyes: PER, EOMI, sclera nonicteric.  Neck: Supple, no masses.  No bruit or JVD.  Pulmonary:  Good air movement, clear to auscultation bilaterally, no use of accessory muscles.  Cardiac: RRR, normal S1, S2, no Murmurs. Vascular: left temple is tender Vessel Right Left  Radial Palpable Palpable  Gastrointestinal: soft, non-distended. No guarding/no peritoneal signs.  Musculoskeletal: M/S 5/5 throughout.  No deformity or atrophy.  Neurologic: CN 2-12 intact. Pain and light touch intact in extremities.  Symmetrical.  Speech is fluent. Motor exam as listed above. Psychiatric: Judgment intact, Mood & affect appropriate for pt's clinical situation. Dermatologic: No rashes or ulcers noted.  No changes consistent with cellulitis. Lymph : No Cervical lymphadenopathy, no lichenification or skin changes of chronic lymphedema.  CBC Lab Results  Component Value Date   WBC 10.3 11/23/2019   HGB 14.5 11/23/2019   HCT 43.2 11/23/2019   MCV 90.0 11/23/2019   PLT 266 11/23/2019    BMET    Component Value Date/Time   NA 141 11/23/2019 1212   K 4.1 11/23/2019 1212   CL 105 11/23/2019 1212   CO2 23 11/23/2019 1212   GLUCOSE 98 11/23/2019 1212   BUN 13 11/23/2019 1212     CREATININE 0.54 11/23/2019 1212   CALCIUM 8.9 11/23/2019 1212   GFRNONAA >60 11/23/2019 1212   GFRAA >60 11/23/2019 1212   Estimated Creatinine Clearance: 59 mL/min (by C-G formula based on SCr of 0.54 mg/dL).  COAG No results found for: INR, PROTIME  Radiology CT Head Wo Contrast  Result Date: 11/23/2019 CLINICAL DATA:  Headache EXAM: CT HEAD WITHOUT CONTRAST TECHNIQUE: Contiguous axial images were obtained from the base of the skull through the vertex without intravenous contrast. COMPARISON:  None. FINDINGS: Brain: No acute intracranial abnormality. Specifically, no hemorrhage, hydrocephalus, mass lesion, acute infarction, or significant intracranial injury. Vascular: No hyperdense vessel or unexpected calcification. Skull: No acute calvarial abnormality. Sinuses/Orbits: Visualized paranasal sinuses and mastoids clear. Orbital soft tissues unremarkable. Other: None IMPRESSION: No intracranial abnormality. Electronically Signed  By: Rolm Baptise M.D.   On: 11/23/2019 11:25     Assessment/Plan 1. Other headache syndrome It does not appear that her headache is temporal arteritis.  The steroids have not helped and her ESR is only slightly elevated.  I have discussed Temporal artery biopsy but she does not agree to have biopsy at this time.  I also don't know how her are symptoms would fit with vasculitis.  This appears to be more radicular and perhaps the Headache and the arm symptoms are both related to degenerative disc disease.  2. Left arm numbness See #1  3. Essential hypertension Continue antihypertensive medications as already ordered, these medications have been reviewed and there are no changes at this time.   4. Gastroesophageal reflux disease without esophagitis Continue PPI as already ordered, this medication has been reviewed and there are no changes at this time.  Avoidence of caffeine and alcohol  Moderate elevation of the head of the bed     Hortencia Pilar,  MD  12/05/2019 1:53 PM

## 2020-01-14 NOTE — Telephone Encounter (Signed)
Patient's PCP calling and following up on referral for patient, she has not heard from anyone and it has been a month. I advised that the referral has been sent and she is just waiting on that office to reach out, let her know she could call them to check on the referral but she doesn't have any of the information.  Please advise.

## 2020-02-21 ENCOUNTER — Telehealth: Payer: Self-pay

## 2020-02-21 NOTE — Telephone Encounter (Signed)
Pt's daughter, Sema, calling about referral SDJ was to send; daughter spoke to surgery place in [place]; they have the referral but there is no specific location he referred her to.  Please call pt's daughter to discuss this.  [phone]

## 2020-02-22 ENCOUNTER — Other Ambulatory Visit: Payer: Self-pay | Admitting: Physician Assistant

## 2020-02-22 ENCOUNTER — Telehealth: Payer: Self-pay | Admitting: Obstetrics and Gynecology

## 2020-02-22 DIAGNOSIS — N6489 Other specified disorders of breast: Secondary | ICD-10-CM

## 2020-02-22 NOTE — Telephone Encounter (Signed)
Rtn'd daughter's call and adv that her mother should be hearing from Dr Sharyn Lull Schroeder's office today. I also gave her the address and phone number to provider's office.

## 2020-02-28 ENCOUNTER — Encounter: Payer: Self-pay | Admitting: Obstetrics and Gynecology

## 2020-02-28 ENCOUNTER — Ambulatory Visit (INDEPENDENT_AMBULATORY_CARE_PROVIDER_SITE_OTHER): Payer: 59 | Admitting: Obstetrics and Gynecology

## 2020-02-28 ENCOUNTER — Other Ambulatory Visit: Payer: Self-pay

## 2020-02-28 VITALS — BP 185/94 | HR 76 | Wt 151.5 lb

## 2020-02-28 DIAGNOSIS — M62838 Other muscle spasm: Secondary | ICD-10-CM | POA: Diagnosis not present

## 2020-02-28 DIAGNOSIS — R3915 Urgency of urination: Secondary | ICD-10-CM

## 2020-02-28 DIAGNOSIS — N941 Unspecified dyspareunia: Secondary | ICD-10-CM

## 2020-02-28 DIAGNOSIS — N393 Stress incontinence (female) (male): Secondary | ICD-10-CM

## 2020-02-28 DIAGNOSIS — N993 Prolapse of vaginal vault after hysterectomy: Secondary | ICD-10-CM

## 2020-02-28 DIAGNOSIS — N952 Postmenopausal atrophic vaginitis: Secondary | ICD-10-CM | POA: Diagnosis not present

## 2020-02-28 DIAGNOSIS — N3941 Urge incontinence: Secondary | ICD-10-CM | POA: Diagnosis not present

## 2020-02-28 DIAGNOSIS — N816 Rectocele: Secondary | ICD-10-CM

## 2020-02-28 LAB — POCT URINALYSIS DIPSTICK
Bilirubin, UA: NEGATIVE
Glucose, UA: NEGATIVE
Ketones, UA: NEGATIVE
Nitrite, UA: NEGATIVE
Odor: NEGATIVE
Protein, UA: NEGATIVE
Spec Grav, UA: <=1.005 — AB (ref 1.010–1.025)
Urobilinogen, UA: 0.2 E.U./dL
pH, UA: 8.5 — AB (ref 5.0–8.0)

## 2020-02-28 MED ORDER — ESTRADIOL 0.1 MG/GM VA CREA: 0.5000 g | TOPICAL_CREAM | VAGINAL | 11 refills | Status: DC

## 2020-02-28 NOTE — Patient Instructions (Signed)
Start estrogen cream:   Start vaginal estrogen therapy nightly for two weeks then 2 times weekly at night for treatment of vaginal atrophy (dryness of the vaginal tissues).  Please let us know if the prescription is too expensive and we can look for alternative options.    A referral was placed for physical therapy to help with your pain. They will call you to set up an appointment.

## 2020-02-28 NOTE — Progress Notes (Signed)
[facility] New Patient Evaluation and Consultation  Referring Provider: Will Bonnet, MD PCP: [place] Date of Service: 02/28/2020  SUBJECTIVE Chief Complaint: New Patient (Initial Visit) (Dr Glennon Mac referred- prolapse)  History of Present Illness: Erica Peters is a 64 y.o. Hispanic female seen in consultation at the request of Dr. Glennon Mac for evaluation of prolapse and incontinence.    Review of records significant for: Has leakage of urine with both stress and urge. She has a vaginal bulge. Has not had prior treatment. S/p hysterectomy 30 years ago.   Urinary Symptoms: Leaks urine with cough/ sneeze and without sensation. Urge predominant, leaks with urge on the way to the bathroom.  Leaks 3 time(s) per days.  Pad use: 2 pads per day.   She is bothered by her UI symptoms.  Day time voids 10.  Nocturia: 3 times per night to void. Voiding dysfunction: she empties her bladder well.  does not use a catheter to empty bladder.  When urinating, she feels the need to urinate multiple times in a row Drinks: 60-80oz water per day, twice a week has a decaf coffee  UTIs: 0 UTI's in the last year.   Denies history of blood in urine and kidney or bladder stones  Pelvic Organ Prolapse Symptoms:                  She Admits to a feeling of a bulge the vaginal area. It has been present for 2 years.  She Admits to seeing a bulge.  This bulge is bothersome.  Bowel Symptom: Bowel movements: 1 time(s) per day Stool consistency: solid Straining: no.  Splinting: yes.  Incomplete evacuation: no.  She Denies accidental bowel leakage / fecal incontinence Bowel regimen: none Last colonoscopy: Date- 5 years ago, Results hemorrhoids, had surgery  Sexual Function Sexually active: no- has not had sex in 2 months because of the pain.   Pelvic Pain Admits to pelvic pain Location: on the vulva- outside of the vagina Pain occurs: hurts with  sitting Improved by: pain medication (ibuprofen) Worsened by: walking, going up stairs  Her pain is her most bothersome symptom.    Past Medical History:  Past Medical History:  Diagnosis Date  . Anemia   . Hypertension      Past Surgical History:   Past Surgical History:  Procedure Laterality Date  . ABDOMINAL HYSTERECTOMY    . BREAST BIOPSY Left 2002   neg  . BREAST CYST ASPIRATION Left 2000   neg  . FLAT FOOT RECONSTRUCTION-TAL GASTROC RECESSION Right 01/26/2016   Procedure: FLAT FOOT RECONSTRUCTION-TAL GASTROC RECESSION;  Surgeon: Samara Deist, DPM;  Location: [facility];  Service: Podiatry;  Laterality: Right;  . FOOT ARTHRODESIS Right 01/26/2016   Procedure: ARTHRODESIS FOOT;  Surgeon: Samara Deist, DPM;  Location: [facility];  Service: Podiatry;  Laterality: Right;  . HEMORRHOID SURGERY    . TRIPLE SUBTALAR FUSION Right 01/26/2016   Procedure: TRIPLE SUBTALAR FUSION;  Surgeon: Samara Deist, DPM;  Location: [facility];  Service: Podiatry;  Laterality: Right;  Marland Kitchen VARICOSE VEIN SURGERY       Past OB/GYN History: G5 P5 Vaginal deliveries: 5,  Forceps/ Vacuum deliveries: 0, Cesarean section: 0 Menopausal: Yes, at age 33 (hysterectomy), Denies vaginal bleeding since menopause Contraception: n/a. Last pap smear was before Hyst.  Any history of abnormal pap smears: no.   Medications: She has a current medication list which includes the following prescription(s): amlodipine, estradiol, ferrous sulfate, gabapentin, misc natural products, oxycodone-acetaminophen,  pantoprazole, prednisone, sennosides, and tramadol.   Allergies: Patient has No Known Allergies.   Social History:  Social History   Tobacco Use  . Smoking status: Never Smoker  . Smokeless tobacco: Never Used  Substance Use Topics  . Alcohol use: No  . Drug use: No    Relationship status: married She lives with husband.   She is not employed. Regular exercise: No History of abuse: No  Family History:    Family History  Problem Relation Age of Onset  . Breast cancer Other      Review of Systems: Review of Systems  Constitutional: Negative for fever, malaise/fatigue and weight loss.  Respiratory: Negative for cough, shortness of breath and wheezing.   Cardiovascular: Negative for chest pain, palpitations and leg swelling.  Gastrointestinal: Negative for abdominal pain and blood in stool.  Genitourinary: Negative for dysuria.  Musculoskeletal: Negative for myalgias.  Skin: Positive for rash.  Neurological: Negative for dizziness and headaches.  Endo/Heme/Allergies: Does not bruise/bleed easily.  Psychiatric/Behavioral: Negative for depression. The patient is not nervous/anxious.      OBJECTIVE Physical Exam: Vitals:   02/28/20 1320  BP: (!) 185/94  Pulse: 76  Weight: 151 lb 8 oz (68.7 kg)    Physical Exam Constitutional:      General: She is not in acute distress. Pulmonary:     Effort: Pulmonary effort is normal.  Abdominal:     General: There is no distension.     Palpations: Abdomen is soft.     Tenderness: There is no abdominal tenderness. There is no rebound.  Musculoskeletal:        General: No swelling. Normal range of motion.  Skin:    General: Skin is warm and dry.     Findings: No rash.  Neurological:     Mental Status: She is alert and oriented to person, place, and time.  Psychiatric:        Mood and Affect: Mood normal.        Behavior: Behavior normal.      GU / Detailed Urogynecologic Evaluation:  Pelvic Exam: Normal external female genitalia; Bartholin's and Skene's glands normal in appearance; urethral meatus normal in appearance, no urethral masses or discharge.   Tenderness with q-tip on right side of vulva and around entire vestibule.   CST: negative  s/p hysterectomy: Speculum exam reveals normal vaginal mucosa with atrophy and normal vaginal cuff.  Adnexa no mass, fullness, tenderness.     Pelvic floor strength I/V  Pelvic floor  musculature: Right levator tender, Right obturator tender, Left levator tender, Left obturator tender  POP-Q:   POP-Q  -2                                            Aa   -2                                           Ba  -5                                              C   2.5  Gh  5                                            Pb  7                                            tvl   0                                            Ap  0                                            Bp   (N/A)                                              D     Rectal Exam:  Normal external rectum.   Post-Void Residual (PVR) by Bladder Scan: In order to evaluate bladder emptying, we discussed obtaining a postvoid residual and she agreed to this procedure.  Procedure: The ultrasound unit was placed on the patient's abdomen in the suprapubic region after the patient had voided. A PVR of 3 ml was obtained by bladder scan.  Laboratory Results: POC urine: moderate leukocytes, trace blood  I visualized the urine specimen, noting the specimen to be clear yellow  ASSESSMENT AND PLAN Ms. Miles Costain is a 64 y.o. with:  1. Levator spasm   2. Vaginal atrophy   3. 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 Prolapse of posterior vaginal wall   6. SUI (stress urinary incontinence, female)   7. Urinary urgency   8. Urge incontinence    1. Levator spasm - The origin of pelvic floor muscle spasm can be multifactorial, including primary, reactive to a different pain source, trauma, or even part of a centralized pain syndrome.Treatment options include pelvic floor physical therapy, local (vaginal) or oral  muscle relaxants, or centrally acting pain medications.   - Explained that this is causing her symptoms of pain and not the prolapse. It can also be contributing to her symptoms of urgency and frequency.  - This pain is her most  bothersome symptom and she would like to try physical therapy   2. Vaginal atrophy - For symptomatic vaginal atrophy options include lubrication with a water-based lubricant, personal hygiene measures and barrier protection against wetness, and estrogen replacement in the form of vaginal cream, vaginal tablets, or a time-released vaginal ring.   - She was prescribed estrace cream 0.5g to place nightly for two weeks then twice a week after. Instructed to place with finger in the vagina.  - For the dryness and discomfort on her labia, recommended using coconut oil as needed.   3. 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 Prescribed the estrogen cream but also can consider an estrogen/ lidocaine combination cream if she still has vestibular  pain following physical therapy.  - Discussed that it is safe to have sex with her prolapse, but she may want to wait until she has had some physical therapy sessions to help with her pain.   4. Stage I anterior, Stage II posterior, Stage I apical prolapse - For treatment of pelvic organ prolapse, we discussed options for management including expectant management, conservative management, and surgical management, such as Kegels, a pessary, pelvic floor physical therapy, and specific surgical procedures. - She is potentially interested in surgery but would like to first address her pelvic pain, since surgery can exacerbate underlying pain from levator spasm. Reviewed that prolapse is not the cause of the pain she is having. We can readdress at next visit.   5. Urge incontinence We discussed the symptoms of overactive bladder (OAB), which include urinary urgency, urinary frequency, nocturia, with or without urge incontinence.  While we do not know the exact etiology of OAB, several treatment options exist. We discussed management including behavioral therapy (decreasing bladder irritants, urge suppression strategies,  timed voids, bladder retraining), physical therapy, medication; for refractory cases, third line therapy is available.  - She is interested in starting with physical therapy first to see if this improves her symptoms. Then she will consider a medication if she is not improved.   6. SUI For treatment of stress urinary incontinence,  non-surgical options include expectant management, weight loss, physical therapy, as well as a pessary.  Surgical options include a midurethral sling, Burch urethropexy, and transurethral injection of a bulking agent. - This is less bothersome to her than urge leakage. If she decides to have surgery will need urodynamics to further assess need for concurrent surgery.   7. Urinary urgency - POC urine positive for only leukocytes. She does not have other symptoms of UTI today. Empties bladder well, normal PVR.    Return 3 months for follow up visit.   Jaquita Folds, MD   Medical Decision Making:  - Reviewed/ ordered a clinical laboratory test - Review and summation of prior records - Independent review of urine specimen

## 2020-04-13 ENCOUNTER — Other Ambulatory Visit: Payer: Self-pay

## 2020-04-13 ENCOUNTER — Ambulatory Visit: Payer: 59 | Attending: Obstetrics and Gynecology | Admitting: Physical Therapy

## 2020-04-13 DIAGNOSIS — M62838 Other muscle spasm: Secondary | ICD-10-CM | POA: Diagnosis present

## 2020-04-13 DIAGNOSIS — R278 Other lack of coordination: Secondary | ICD-10-CM | POA: Insufficient documentation

## 2020-04-13 NOTE — Therapy (Signed)
[facility] [address] [place], [place], [address] Phone: [phone]   Fax:  [phone]  Physical Therapy Evaluation  Patient Details  Name: Erica Peters MRN: [id number] Date of Birth: 1955-10-09 Referring Provider (PT): Florian Buff   Encounter Date: 04/13/2020   PT End of Session - 04/13/20 8756    Visit Number 1    Number of Visits 8    Date for PT Re-Evaluation 06/08/20    PT Start Time 0815    PT Stop Time 0900    PT Time Calculation (min) 45 min    Activity Tolerance Patient tolerated treatment well    Behavior During Therapy [facility] for tasks assessed/performed           Past Medical History:  Diagnosis Date  . Anemia   . Hypertension     Past Surgical History:  Procedure Laterality Date  . ABDOMINAL HYSTERECTOMY    . BREAST BIOPSY Left 2002   neg  . BREAST CYST ASPIRATION Left 2000   neg  . FLAT FOOT RECONSTRUCTION-TAL GASTROC RECESSION Right 01/26/2016   Procedure: FLAT FOOT RECONSTRUCTION-TAL GASTROC RECESSION;  Surgeon: Gwyneth Revels, DPM;  Location: [facility] ORS;  Service: Podiatry;  Laterality: Right;  . FOOT ARTHRODESIS Right 01/26/2016   Procedure: ARTHRODESIS FOOT;  Surgeon: Gwyneth Revels, DPM;  Location: [facility] ORS;  Service: Podiatry;  Laterality: Right;  . HEMORRHOID SURGERY    . TRIPLE SUBTALAR FUSION Right 01/26/2016   Procedure: TRIPLE SUBTALAR FUSION;  Surgeon: Gwyneth Revels, DPM;  Location: [facility] ORS;  Service: Podiatry;  Laterality: Right;  Marland Kitchen VARICOSE VEIN SURGERY      There were no vitals filed for this visit.        [facility] PT Assessment - 04/13/20 0001      Assessment   Medical Diagnosis Dyspareunia    Referring Provider (PT) Florian Buff    Prior Therapy None for this dx            PELVIC HEALTH PHYSICAL THERAPY EVALUATION  SCREENING Red Flags: None Have you had any night sweats? Unexplained weight loss? Saddle anesthesia? Unexplained changes in bowel or bladder  habits?  Precautions: Prolapse  SUBJECTIVE  Chief Complaint: Patient states that she has pain with sitting on any hard surface. Patient also has pain with clothing touching the area. Patient notes her prolapse is bulging and she clenches the muscles for support but she has pain at the perineum and the superior portion of the vulva (pointing to model). Patient notes that she gets some relief from heating pad.   Pertinent History:  Falls Negative.  Scoliosis Negative. Pulmonary disease/dysfunction Negative. Surgical history: Positive for abdominal hysterectomy.   Recent Procedures/Tests/Findings: Per MD note: "GU / Detailed Urogynecologic Evaluation:  Pelvic Exam: Normal external female genitalia; Bartholin's and Skene's glands normal in appearance; urethral meatus normal in appearance, no urethral masses or discharge.   Tenderness with q-tip on right side of vulva and around entire vestibule.   CST: negative  s/p hysterectomy: Speculum exam reveals normal vaginal mucosa with atrophy and normal vaginal cuff.  Adnexa no mass, fullness, tenderness.     Pelvic floor strength I/V  Pelvic floor musculature: Right levator tender, Right obturator tender, Left levator tender, Left obturator tender  POP-Q:   POP-Q  -2  Aa   -2                                           Ba  -5                                              C   2.5                                            Gh  5                                            Pb  7                                            tvl   0                                            Ap  0                                            Bp   (N/A)                                              D     Rectal Exam:  Normal external rectum.   Post-Void Residual (PVR) by Bladder Scan: In order to evaluate bladder emptying, we discussed obtaining a postvoid residual and she agreed to this  procedure.  Procedure: The ultrasound unit was placed on the patient's abdomen in the suprapubic region after the patient had voided. A PVR of 3 ml was obtained by bladder scan.  Laboratory Results: POC urine: moderate leukocytes, trace blood  I visualized the urine specimen, noting the specimen to be clear yellow"   Obstetrical History: G5P5 Deliveries: vaginal, use of forceps (10# baby) Tearing/Episiotomy: 1 episiotomy  Birthing position: back  Gynecological History: Hysterectomy: Yes Abdominal Pain with exam: Yes   Urinary History: Incontinence: Positive. Onset: unsure, longstanding; Triggers:  urge and stress components  Amount: Mod/Complete Loss.  Fluid Intake: 2-4 x16 oz H20, tea caffeinated, no juices/sodas Nocturia: 3x/night Pads: 2/day Frequency of urination: every 30 min-2 hours Pain with urination: Positive Difficulty initiating urination: Positive Intermittent stream: Positive Frequent UTI: Positive for hx.   Gastrointestinal History: [place] Stool Chart: Type 1 and 7 Frequency of BMs: 3x/week Pain with defecation: Positive Straining with defecation: Positive Incontinence: Negative.   Sexual activity/pain: Pain with intercourse: Positive.   Initial penetration: Yes Unable to penetrate 2/2 to pain and muscle spasm. This started  3-4 mo ago.   Location of pain: perineum and superior vulvar tissue Current pain:  8/10  Max pain:  10/10 Least pain:  5/10 (with medication and heating pad) Pain quality: pain quality: burning Radiating pain: No   Current activities:  Cleaning, cooking; previously enjoyed elliptical  Patient Goals:  Pain under control prior to surgical intervention  Patient perception of overall health: Good  OBJECTIVE  Mental Status Patient is oriented to person, place and time.  Recent memory is intact.  Remote memory is intact.  Attention span and concentration are intact.  Expressive speech is intact.  Patient's fund of  knowledge is within normal limits for educational level.  POSTURE/OBSERVATIONS:  Sitting: unable to sit for long periods; frequent repositioning/lateral lean to R Standing: anterior pelvic tilt and predominant WB on RLE, increased thoracic kyphosis   GAIT: Grossly WFL  RANGE OF MOTION: deferred 2/2 to time constraints   LEFT RIGHT  Lumbar forward flexion (65):      Lumbar extension (30):     Lumbar lateral flexion (25):     Thoracic and Lumbar rotation (30 degrees):       Hip Flexion (0-125):      Hip IR (0-45):     Hip ER (0-45):     Hip Abduction (0-40):     Hip extension (0-15):       STRENGTH: MMT deferred 2/2 to time constraints  RLE LLE  Hip Flexion    Hip Extension    Hip Abduction     Hip Adduction     Hip ER     Hip IR     Knee Extension    Knee Flexion    Dorsiflexion     Plantarflexion (seated)     ABDOMINAL: deferred 2/2 to time constraints Palpation: Diastasis: Scar mobility: Rib flare:  SPECIAL TESTS: deferred 2/2 to time constraints   PHYSICAL PERFORMANCE MEASURES: STS:  Deep Squat: RLE STS: LLE STS:  :   EXTERNAL PELVIC EXAM: deferred 2/2 to time constraints Palpation: Breath coordination: Cued Lengthen: Cued Contraction: Cough:  INTERNAL VAGINAL EXAM: deferred 2/2 to time constraints Introitus Appears:   Skin integrity:  Scar mobility: Strength (PERF):  Symmetry: Palpation: Prolapse:   INTERNAL RECTAL EXAM: deferred 2/2 to time constraints Strength (PERF): Symmetry: Palpation: Prolapse:   OUTCOME MEASURES: FOTO Urinary 60 (PFDI Prolapse 63, PFDI Pain 67, PFDI Urinary 54)   ASSESSMENT Patient is a 64 year old presenting to clinic with chief complaints of pelvic pain and POP. Upon evaluation, patient demonstrates deficits in IAP management, PFM extensibility, PFM coordination, PFM strength, posture, and pain as evidenced by increased WB on R in sitting and standing, worst pain 10/10, straining with defecation and  difficulty initiating/maintaining flow of urine, mixed UI, pain with all sexual activity (external and internal) and inability to penetrate. Patient's responses on FOTO outcome measures (Urinary 60; PFDI Prolapse 63, PFDI Pain 67, PFDI Urinary 54) indicate severe functional limitations/disability/distress. Patient's progress may be limited due to pain intensity and duration; however, patient's motivation is advantageous. Patient was able to achieve basic understanding of PFM function and toileting posture for decreased straining during today's evaluation and responded positively to educational interventions. Patient will benefit from continued skilled therapeutic intervention to address deficits in IAP management, PFM extensibility, PFM coordination, PFM strength, posture, and pain in order to be a surgical candidate, increase function, and improve overall QOL.  EDUCATION Patient educated on prognosis, POC, and provided with HEP including: toileting posture. Patient articulated understanding and  returned demonstration. Patient will benefit from further education in order to maximize compliance and understanding for long-term therapeutic gains.  TREATMENT Neuromuscular Re-education: Patient educated on primary functions of the pelvic floor including: posture/balance, sexual pleasure, storage and elimination of waste from the body, abdominal cavity closure, and breath coordination. Patient education on toileting posture for decreased straining and improved IAP management.   Objective measurements completed on examination: See above findings.        PT Long Term Goals - 04/13/20 0949      PT LONG TERM GOAL #1   Title Patient will demonstrate independence with HEP in order to maximize therapeutic gains and improve carryover from physical therapy sessions to ADLs in the home and community.    Baseline IE: not demonstated    Time 8    Period Weeks    Status New    Target Date 06/08/20      PT  LONG TERM GOAL #2   Title Patient will demonstrate improved function as evidenced by a score of 63 on FOTO Urinary measure for full participation in activities at home and in the community.    Baseline IE: 60    Time 8    Period Weeks    Status New    Target Date 06/08/20      PT LONG TERM GOAL #3   Title Patient will demonstrate improved function as evidenced by a score of <45 on FOTO: PFDI Pain measure for full participation in activities at home and in the community.    Baseline IE: 67    Time 8    Period Weeks    Status New    Target Date 06/08/20      PT LONG TERM GOAL #4   Title Patient will decrease worst pain as reported on NPRS by at least 2 points with self-reported decreased frequency to demonstrate clinically significant reduction in pain in order to restore/improve function and overall QOL.    Baseline IE: 10/10    Time 8    Period Weeks    Status New    Target Date 06/08/20      PT LONG TERM GOAL #5   Title Patient will report being able to return to activities including, but not limited to: gynecological exam, partner intimacy, and elliptical use without pain or limitation to indicate complete resolution of the chief complaint and return to prior level of participation at home and in the community.    Baseline IE: exam 8/10, penetration 10/10, elliptical 7/10    Time 8    Period Weeks    Status New    Target Date 06/08/20                  Plan - 04/13/20 0981    Clinical Impression Statement Patient is a 64 year old presenting to clinic with chief complaints of pelvic pain and POP. Upon evaluation, patient demonstrates deficits in IAP management, PFM extensibility, PFM coordination, PFM strength, posture, and pain as evidenced by increased WB on R in sitting and standing, worst pain 10/10, straining with defecation and difficulty initiating/maintaining flow of urine, mixed UI, pain with all sexual activity (external and internal) and inability to penetrate.  Patient's responses on FOTO outcome measures (Urinary 60; PFDI Prolapse 63, PFDI Pain 67, PFDI Urinary 54) indicate severe functional limitations/disability/distress. Patient's progress may be limited due to pain intensity and duration; however, patient's motivation is advantageous. Patient was able to achieve basic understanding of PFM function and  toileting posture for decreased straining during today's evaluation and responded positively to educational interventions. Patient will benefit from continued skilled therapeutic intervention to address deficits in IAP management, PFM extensibility, PFM coordination, PFM strength, posture, and pain in order to be a surgical candidate, increase function, and improve overall QOL.    Personal Factors and Comorbidities Age;Behavior Pattern;Comorbidity 3+;Past/Current Experience;Time since onset of injury/illness/exacerbation    Comorbidities anemia, HTN, hx of hysterectomy,    Examination-Activity Limitations Continence;Toileting;Other    Examination-Participation Restrictions Interpersonal Relationship    Stability/Clinical Decision Making Evolving/Moderate complexity    Clinical Decision Making Moderate    Rehab Potential Fair    PT Frequency 1x / week    PT Duration 8 weeks    PT Treatment/Interventions ADLs/Self Care Home Management;Biofeedback;Moist Heat;Cryotherapy;Electrical Stimulation;Therapeutic exercise;Neuromuscular re-education;Therapeutic activities;Scar mobilization;Taping;Manual techniques;Patient/family education;Ultrasound;DME Instruction;Spinal Manipulations;Joint Manipulations    PT Next Visit Plan physical assessment    PT Home Exercise Plan toileting posture    Consulted and Agree with Plan of Care Patient           Patient will benefit from skilled therapeutic intervention in order to improve the following deficits and impairments:  Increased muscle spasms,Improper body mechanics,Postural dysfunction,Pain,Impaired  flexibility,Increased fascial restricitons,Decreased scar mobility,Decreased coordination,Decreased endurance  Visit Diagnosis: Other muscle spasm  Other lack of coordination     Problem List Patient Active Problem List   Diagnosis Date Noted  . Headache 12/05/2019  . Hypertension 12/02/2019  . Low back pain 05/20/2017  . Burning sensation of feet 03/04/2017  . Left arm numbness 03/04/2017  . Nerve damage 01/01/2017  . Gastroesophageal reflux disease without esophagitis 06/26/2016  . Anemia, iron deficiency 10/04/2015   Myles Gip PT, DPT [phone]  04/13/2020, 9:52 AM  [place] [facility] [address] [place], [place], [address] Phone: [phone]   Fax:  [phone]  Name: Erica Peters MRN: [id number] Date of Birth: [birth year]

## 2020-04-20 ENCOUNTER — Ambulatory Visit: Payer: 59 | Admitting: Physical Therapy

## 2020-04-20 ENCOUNTER — Other Ambulatory Visit: Payer: Self-pay

## 2020-04-20 ENCOUNTER — Encounter: Payer: Self-pay | Admitting: Physical Therapy

## 2020-04-20 DIAGNOSIS — M62838 Other muscle spasm: Secondary | ICD-10-CM

## 2020-04-20 DIAGNOSIS — R278 Other lack of coordination: Secondary | ICD-10-CM

## 2020-04-20 NOTE — Therapy (Signed)
[place] [facility] [address] [place], [place], [address] Phone: [phone]   Fax:  [phone]  Physical Therapy Treatment  Patient Details  Name: Korena Hebron MRN: [id number] Date of Birth: October 19, 1955 Referring Provider (PT): Wannetta Sender   Encounter Date: 04/20/2020   PT End of Session - 04/20/20 1206    Visit Number 2    Number of Visits 8    Date for PT Re-Evaluation 06/08/20    PT Start Time 0801    PT Stop Time 0848    PT Time Calculation (min) 47 min    Activity Tolerance Patient tolerated treatment well    Behavior During Therapy [facility] for tasks assessed/performed           Past Medical History:  Diagnosis Date  . Anemia   . Hypertension     Past Surgical History:  Procedure Laterality Date  . ABDOMINAL HYSTERECTOMY    . BREAST BIOPSY Left 2002   neg  . BREAST CYST ASPIRATION Left 2000   neg  . FLAT FOOT RECONSTRUCTION-TAL GASTROC RECESSION Right 01/26/2016   Procedure: FLAT FOOT RECONSTRUCTION-TAL GASTROC RECESSION;  Surgeon: Samara Deist, DPM;  Location: [facility] ORS;  Service: Podiatry;  Laterality: Right;  . FOOT ARTHRODESIS Right 01/26/2016   Procedure: ARTHRODESIS FOOT;  Surgeon: Samara Deist, DPM;  Location: [facility] OR[facility];  Service: Podiatry;  Laterality: Right;  . HEMORRHOID SURGERY    . TRIPLE SUBTALAR FUSION Right 01/26/2016   Procedure: TRIPLE SUBTALAR FUSION;  Surgeon: Samara Deist, DPM;  Location: [facility] OR[facility];  Service: Podiatry;  Laterality: Right;  Marland Kitchen VARICOSE VEIN SURGERY      There were no vitals filed for this visit.   Subjective Assessment - 04/20/20 0806    Subjective Patient presents to clinic with continued complaints of pelvic and abdominal pain. She notes that on Tuesday she had considerable pain in the lower abdominal area just superior to the pubic symphyisis. Patient notes that when urinating last night she had high pain intensity with difficulty with starting to urinate. Patient denies  any signs of infection. Patient has been using a donut cushion with good relief. Patient requests to leave early (by 15 minutes) in order to make it to another appointment. Patient has been using prescribed ointment and coconut oil with good effect at home.    Patient is accompained by: Interpreter   Clearence Cheek, CAP   Currently in Pain? Yes    Pain Score 5     Pain Location Genitalia          TREATMENT Manual Therapy: Scar mobilization at abdominal scars to allow for improved mobility and function MFR of lower abdominal wall (Scarpa's fascia) for improved mobility and decreased pain  Neuromuscular Re-education: Supine hooklying diaphragmatic breathing with VCs and TCs for downregulation of the nervous system and improved management of IAP Supine hooklying, PFM lengthening with inhalation. VCs and TCs to decrease compensatory patterns and encourage optimal relaxation of the PFM. Patient education on body mechanics when sitting for decreased irritation of pudendal nerve and branches, importance of loose clothing, belly breathing for pain management and decreased irritation.  Patient educated throughout session on appropriate technique and form using multi-modal cueing, HEP, and activity modification. Patient articulated understanding and returned demonstration.  Patient Response to interventions: Patient reports decreased pain  ASSESSMENT Patient presents to clinic with excellent motivation to participate in therapy. Patient demonstrates deficits in IAP management, PFM extensibility, PFM coordination, PFM strength, posture, and pain. Patient able to achieve pain  reduction with scar mobilization and MFR during today's session and responded positively to educational interventions. Patient will benefit from continued skilled therapeutic intervention to address remaining deficits in IAP management, PFM extensibility, PFM coordination, PFM strength, posture, and pain in order to increase  function and improve overall QOL.     PT Long Term Goals - 04/13/20 0949      PT LONG TERM GOAL #1   Title Patient will demonstrate independence with HEP in order to maximize therapeutic gains and improve carryover from physical therapy sessions to ADLs in the home and community.    Baseline IE: not demonstated    Time 8    Period Weeks    Status New    Target Date 06/08/20      PT LONG TERM GOAL #2   Title Patient will demonstrate improved function as evidenced by a score of 63 on FOTO Urinary measure for full participation in activities at home and in the community.    Baseline IE: 60    Time 8    Period Weeks    Status New    Target Date 06/08/20      PT LONG TERM GOAL #3   Title Patient will demonstrate improved function as evidenced by a score of <45 on FOTO: PFDI Pain measure for full participation in activities at home and in the community.    Baseline IE: 67    Time 8    Period Weeks    Status New    Target Date 06/08/20      PT LONG TERM GOAL #4   Title Patient will decrease worst pain as reported on NPRS by at least 2 points with self-reported decreased frequency to demonstrate clinically significant reduction in pain in order to restore/improve function and overall QOL.    Baseline IE: 10/10    Time 8    Period Weeks    Status New    Target Date 06/08/20      PT LONG TERM GOAL #5   Title Patient will report being able to return to activities including, but not limited to: gynecological exam, partner intimacy, and elliptical use without pain or limitation to indicate complete resolution of the chief complaint and return to prior level of participation at home and in the community.    Baseline IE: exam 8/10, penetration 10/10, elliptical 7/10    Time 8    Period Weeks    Status New    Target Date 06/08/20                 Plan - 04/20/20 1207    Clinical Impression Statement Patient presents to clinic with excellent motivation to participate in therapy.  Patient demonstrates deficits in IAP management, PFM extensibility, PFM coordination, PFM strength, posture, and pain. Patient able to achieve pain reduction with scar mobilization and MFR during today's session and responded positively to educational interventions. Patient will benefit from continued skilled therapeutic intervention to address remaining deficits in IAP management, PFM extensibility, PFM coordination, PFM strength, posture, and pain in order to increase function and improve overall QOL.    Personal Factors and Comorbidities Age;Behavior Pattern;Comorbidity 3+;Past/Current Experience;Time since onset of injury/illness/exacerbation    Comorbidities anemia, HTN, hx of hysterectomy,    Examination-Activity Limitations Continence;Toileting;Other    Examination-Participation Restrictions Interpersonal Relationship    Stability/Clinical Decision Making Evolving/Moderate complexity    Rehab Potential Fair    PT Frequency 1x / week    PT Duration 8 weeks  PT Treatment/Interventions ADLs/Self Care Home Management;Biofeedback;Moist Heat;Cryotherapy;Electrical Stimulation;Therapeutic exercise;Neuromuscular re-education;Therapeutic activities;Scar mobilization;Taping;Manual techniques;Patient/family education;Ultrasound;DME Instruction;Spinal Manipulations;Joint Manipulations    PT Next Visit Plan genital hygiene education; gentle spinal mobility; log roll    PT Home Exercise Plan toileting posture; scar massage    Consulted and Agree with Plan of Care Patient           Patient will benefit from skilled therapeutic intervention in order to improve the following deficits and impairments:  Increased muscle spasms,Improper body mechanics,Postural dysfunction,Pain,Impaired flexibility,Increased fascial restricitons,Decreased scar mobility,Decreased coordination,Decreased endurance  Visit Diagnosis: Other muscle spasm  Other lack of coordination     Problem List Patient Active Problem  List   Diagnosis Date Noted  . Headache 12/05/2019  . Hypertension 12/02/2019  . Low back pain 05/20/2017  . Burning sensation of feet 03/04/2017  . Left arm numbness 03/04/2017  . Nerve damage 01/01/2017  . Gastroesophageal reflux disease without esophagitis 06/26/2016  . Anemia, iron deficiency 10/04/2015   Sheria Lang PT, DPT [id number] 04/20/2020, 12:15 PM  [place] [facility] [address] [place], [place], [address] Phone: [phone]   Fax:  [phone]  Name: Angellee Cohill MRN: [id number] Date of Birth: 04-25-1955

## 2020-04-27 ENCOUNTER — Ambulatory Visit: Payer: Medicare Other | Attending: Obstetrics and Gynecology | Admitting: Physical Therapy

## 2020-04-27 ENCOUNTER — Encounter: Payer: Self-pay | Admitting: Physical Therapy

## 2020-04-27 ENCOUNTER — Other Ambulatory Visit: Payer: Self-pay

## 2020-04-27 DIAGNOSIS — R278 Other lack of coordination: Secondary | ICD-10-CM | POA: Diagnosis present

## 2020-04-27 DIAGNOSIS — M62838 Other muscle spasm: Secondary | ICD-10-CM | POA: Diagnosis present

## 2020-04-27 NOTE — Therapy (Signed)
[facility] [address] [place], [place], [address] Phone: [phone]   Fax:  [phone]  Physical Therapy Treatment  Patient Details  Name: Erica Peters MRN: [id number] Date of Birth: 1955-11-22 Referring Provider (PT): Wannetta Sender   Encounter Date: 04/27/2020   PT End of Session - 04/27/20 0802    Visit Number 3    Number of Visits 8    Date for PT Re-Evaluation 06/08/20    PT Start Time 0800    PT Stop Time 0845    PT Time Calculation (min) 45 min    Activity Tolerance Patient tolerated treatment well    Behavior During Therapy [facility] for tasks assessed/performed           Past Medical History:  Diagnosis Date  . Anemia   . Hypertension     Past Surgical History:  Procedure Laterality Date  . ABDOMINAL HYSTERECTOMY    . BREAST BIOPSY Left 2002   neg  . BREAST CYST ASPIRATION Left 2000   neg  . FLAT FOOT RECONSTRUCTION-TAL GASTROC RECESSION Right 01/26/2016   Procedure: FLAT FOOT RECONSTRUCTION-TAL GASTROC RECESSION;  Surgeon: Samara Deist, DPM;  Location: [facility] ORS;  Service: Podiatry;  Laterality: Right;  . FOOT ARTHRODESIS Right 01/26/2016   Procedure: ARTHRODESIS FOOT;  Surgeon: Samara Deist, DPM;  Location: [facility] ORS;  Service: Podiatry;  Laterality: Right;  . HEMORRHOID SURGERY    . TRIPLE SUBTALAR FUSION Right 01/26/2016   Procedure: TRIPLE SUBTALAR FUSION;  Surgeon: Samara Deist, DPM;  Location: [facility] OR[facility];  Service: Podiatry;  Laterality: Right;  Marland Kitchen VARICOSE VEIN SURGERY      There were no vitals filed for this visit.   Subjective Assessment - 04/27/20 0915    Subjective Patient states that she is amenable and consents to start and complete session without an interpreter. Patient notes that she has been performing her abdominal scar massage daily and with the use of massage and her medicine her suprapubic pain has decreased significantly. She notes the worst her pain was in the past week was a 4/10 at  the superior portion of the vulva. She has also been practicing her breathing. She reports that she has not even been relying much on medicine to manage pain at this point. She presents wearing looser, softer pants today. Notes pain is much more localized to the vulvar and vaginal tissue. Patient requests to leave at 845 2/2 to caregiving demands for grandson.    Patient is accompained by: --    Currently in Pain? No/denies           TREATMENT Manual Therapy: Intercostal mm release lower ribs, B, with coordinated breath for improved posture and diaphragmatic excursion Transverse rib mbilizations for improved spinal mobility and decreased pain, B  Neuromuscular Re-education: Supine hooklying diaphragmatic breathing with VCs and TCs for downregulation of the nervous system and improved management of IAP Sidelying thoracic rotation with PT overpressure and coordinated breath for improved spinal mobility and lower rib excursion Supine hooklying pelvic tilts with coordinated breath for improved spinal mobility and PFM stretch Patient education on genital hygiene including ceasing use of soap on vaginal tissue to decrease sensitivity and irritation of vaginal tissue.  Patient educated throughout session on appropriate technique and form using multi-modal cueing, HEP, and activity modification. Patient articulated understanding and returned demonstration.  Patient Response to interventions: Patient denies increased pain.  ASSESSMENT Patient presents to clinic with excellent motivation to participate in therapy. Patient demonstrates deficits in IAP management,  PFM extensibility, PFM coordination, PFM strength, posture, and pain. Patient able to perform all spinal mobility activities with good form and tolerance during today's session and responded positively to educational interventions. Patient will benefit from continued skilled therapeutic intervention to address remaining deficits in IAP  management, PFM extensibility, PFM coordination, PFM strength, posture, and pain in order to increase function and improve overall QOL.       PT Long Term Goals - 04/13/20 0949      PT LONG TERM GOAL #1   Title Patient will demonstrate independence with HEP in order to maximize therapeutic gains and improve carryover from physical therapy sessions to ADLs in the home and community.    Baseline IE: not demonstated    Time 8    Period Weeks    Status New    Target Date 06/08/20      PT LONG TERM GOAL #2   Title Patient will demonstrate improved function as evidenced by a score of 63 on FOTO Urinary measure for full participation in activities at home and in the community.    Baseline IE: 60    Time 8    Period Weeks    Status New    Target Date 06/08/20      PT LONG TERM GOAL #3   Title Patient will demonstrate improved function as evidenced by a score of <45 on FOTO: PFDI Pain measure for full participation in activities at home and in the community.    Baseline IE: 67    Time 8    Period Weeks    Status New    Target Date 06/08/20      PT LONG TERM GOAL #4   Title Patient will decrease worst pain as reported on NPRS by at least 2 points with self-reported decreased frequency to demonstrate clinically significant reduction in pain in order to restore/improve function and overall QOL.    Baseline IE: 10/10    Time 8    Period Weeks    Status New    Target Date 06/08/20      PT LONG TERM GOAL #5   Title Patient will report being able to return to activities including, but not limited to: gynecological exam, partner intimacy, and elliptical use without pain or limitation to indicate complete resolution of the chief complaint and return to prior level of participation at home and in the community.    Baseline IE: exam 8/10, penetration 10/10, elliptical 7/10    Time 8    Period Weeks    Status New    Target Date 06/08/20                 Plan - 04/27/20 0803     Clinical Impression Statement Patient presents to clinic with excellent motivation to participate in therapy. Patient demonstrates deficits in IAP management, PFM extensibility, PFM coordination, PFM strength, posture, and pain. Patient able to perform all spinal mobility activities with good form and tolerance during today's session and responded positively to educational interventions. Patient will benefit from continued skilled therapeutic intervention to address remaining deficits in IAP management, PFM extensibility, PFM coordination, PFM strength, posture, and pain in order to increase function and improve overall QOL.    Personal Factors and Comorbidities Age;Behavior Pattern;Comorbidity 3+;Past/Current Experience;Time since onset of injury/illness/exacerbation    Comorbidities anemia, HTN, hx of hysterectomy,    Examination-Activity Limitations Continence;Toileting;Other    Examination-Participation Restrictions Interpersonal Relationship    Stability/Clinical Decision Making Evolving/Moderate complexity  Rehab Potential Fair    PT Frequency 1x / week    PT Duration 8 weeks    PT Treatment/Interventions ADLs/Self Care Home Management;Biofeedback;Moist Heat;Cryotherapy;Electrical Stimulation;Therapeutic exercise;Neuromuscular re-education;Therapeutic activities;Scar mobilization;Taping;Manual techniques;Patient/family education;Ultrasound;DME Instruction;Spinal Manipulations;Joint Manipulations    PT Next Visit Plan genital hygiene education; gentle spinal mobility; log roll    PT Home Exercise Plan toileting posture; scar massage    Consulted and Agree with Plan of Care Patient           Patient will benefit from skilled therapeutic intervention in order to improve the following deficits and impairments:  Increased muscle spasms,Improper body mechanics,Postural dysfunction,Pain,Impaired flexibility,Increased fascial restricitons,Decreased scar mobility,Decreased coordination,Decreased  endurance  Visit Diagnosis: Other muscle spasm  Other lack of coordination     Problem List Patient Active Problem List   Diagnosis Date Noted  . Headache 12/05/2019  . Hypertension 12/02/2019  . Low back pain 05/20/2017  . Burning sensation of feet 03/04/2017  . Left arm numbness 03/04/2017  . Nerve damage 01/01/2017  . Gastroesophageal reflux disease without esophagitis 06/26/2016  . Anemia, iron deficiency 10/04/2015   Myles Gip PT, DPT [phone]  04/27/2020, 1:38 PM  [facility] [address] [place], [place], [address] Phone: [phone]   Fax:  [phone]  Name: Erica Peters MRN: [id number] Date of Birth: 1956-01-18

## 2020-05-04 ENCOUNTER — Encounter: Payer: Self-pay | Admitting: Physical Therapy

## 2020-05-04 ENCOUNTER — Ambulatory Visit: Payer: Medicare Other | Admitting: Physical Therapy

## 2020-05-04 ENCOUNTER — Other Ambulatory Visit: Payer: Self-pay

## 2020-05-04 DIAGNOSIS — R278 Other lack of coordination: Secondary | ICD-10-CM

## 2020-05-04 DIAGNOSIS — M62838 Other muscle spasm: Secondary | ICD-10-CM | POA: Diagnosis not present

## 2020-05-04 NOTE — Therapy (Signed)
[facility] [address] [place], [place], [address] Phone: [phone]   Fax:  [phone]  Physical Therapy Treatment  Patient Details  Name: Erica Peters MRN: [id number] Date of Birth: [birth year] Referring Provider (PT): Wannetta Sender   Encounter Date: 05/04/2020   PT End of Session - 05/04/20 0814    Visit Number 4    Number of Visits 8    Date for PT Re-Evaluation 06/08/20    PT Start Time 0800    PT Stop Time 0845    PT Time Calculation (min) 45 min    Activity Tolerance Patient tolerated treatment well    Behavior During Therapy [facility] for tasks assessed/performed           Past Medical History:  Diagnosis Date  . Anemia   . Hypertension     Past Surgical History:  Procedure Laterality Date  . ABDOMINAL HYSTERECTOMY    . BREAST BIOPSY Left 2002   neg  . BREAST CYST ASPIRATION Left 2000   neg  . FLAT FOOT RECONSTRUCTION-TAL GASTROC RECESSION Right 01/26/2016   Procedure: FLAT FOOT RECONSTRUCTION-TAL GASTROC RECESSION;  Surgeon: Samara Deist, DPM;  Location: [facility];  Service: Podiatry;  Laterality: Right;  . FOOT ARTHRODESIS Right 01/26/2016   Procedure: ARTHRODESIS FOOT;  Surgeon: Samara Deist, DPM;  Location: [facility];  Service: Podiatry;  Laterality: Right;  . HEMORRHOID SURGERY    . TRIPLE SUBTALAR FUSION Right 01/26/2016   Procedure: TRIPLE SUBTALAR FUSION;  Surgeon: Samara Deist, DPM;  Location: [facility];  Service: Podiatry;  Laterality: Right;  Marland Kitchen VARICOSE VEIN SURGERY      There were no vitals filed for this visit.   Subjective Assessment - 05/04/20 0805    Subjective Patient states that she is amenable and consents to start and complete session without an interpreter. Patient notes exercises have been going well. She denies any new or increased pain. Patient notes that she has no pain right now. Patient even notes that when she touches the area, she is without pain. Patient states that she is  going to try riding her elliptical this afternoon to see how she tolerates it. 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 Patient denies any UI during waking hours, although she continues to use a pad for comfort/confidence. She continues to get up 3x/night to void.    Currently in Pain? No/denies            TREATMENT Neuromuscular Re-education: Supine hooklying diaphragmatic breathing with VCs and TCs for downregulation of the nervous system and improved management of IAP Sidelying, pelvic tilts with diaphragmatic breathing for improved diaphragmatic and rib cage excursion. VCs and TCs to prevent compensations. Supine hooklying, PFM lengthening with inhalation. VCs and TCs to decrease compensatory patterns and encourage optimal relaxation of the PFM. Patient education on graded exposure return to elliptical use.   Patient educated throughout session on appropriate technique and form using multi-modal cueing, HEP, and activity modification. Patient articulated understanding and returned demonstration.  Patient Response to interventions: Patient denies increased pain.  ASSESSMENT Patient presents to clinic with excellent motivation to participate in therapy. Patient demonstrates deficits in IAP management, PFM extensibility, PFM coordination, PFM strength, posture, and pain. Patient indicated significant progress toward goals as measured by FOTO during today's session and responded positively to neuromuscular re-education interventions for PFM coordination. Patient will benefit from continued skilled therapeutic intervention to  address remaining deficits in IAP management, PFM extensibility, PFM coordination, PFM strength, posture, and pain in order to increase function and improve overall QOL.    PT Long Term Goals - 04/13/20 0949      PT LONG TERM GOAL #1   Title Patient will demonstrate  independence with HEP in order to maximize therapeutic gains and improve carryover from physical therapy sessions to ADLs in the home and community.    Baseline IE: not demonstated    Time 8    Period Weeks    Status New    Target Date 06/08/20      PT LONG TERM GOAL #2   Title Patient will demonstrate improved function as evidenced by a score of 63 on FOTO Urinary measure for full participation in activities at home and in the community.    Baseline IE: 60    Time 8    Period Weeks    Status New    Target Date 06/08/20      PT LONG TERM GOAL #3   Title Patient will demonstrate improved function as evidenced by a score of <45 on FOTO: PFDI Pain measure for full participation in activities at home and in the community.    Baseline IE: 67    Time 8    Period Weeks    Status New    Target Date 06/08/20      PT LONG TERM GOAL #4   Title Patient will decrease worst pain as reported on NPRS by at least 2 points with self-reported decreased frequency to demonstrate clinically significant reduction in pain in order to restore/improve function and overall QOL.    Baseline IE: 10/10    Time 8    Period Weeks    Status New    Target Date 06/08/20      PT LONG TERM GOAL #5   Title Patient will report being able to return to activities including, but not limited to: gynecological exam, partner intimacy, and elliptical use without pain or limitation to indicate complete resolution of the chief complaint and return to prior level of participation at home and in the community.    Baseline IE: exam 8/10, penetration 10/10, elliptical 7/10    Time 8    Period Weeks    Status New    Target Date 06/08/20                 Plan - 05/04/20 5277    Clinical Impression Statement Patient presents to clinic with excellent motivation to participate in therapy. Patient demonstrates deficits in IAP management, PFM extensibility, PFM coordination, PFM strength, posture, and pain. Patient indicated  significant progress toward goals as measured by FOTO during today's session and responded positively to neuromuscular re-education interventions for PFM coordination. Patient will benefit from continued skilled therapeutic intervention to address remaining deficits in IAP management, PFM extensibility, PFM coordination, PFM strength, posture, and pain in order to increase function and improve overall QOL.    Personal Factors and Comorbidities Age;Behavior Pattern;Comorbidity 3+;Past/Current Experience;Time since onset of injury/illness/exacerbation    Comorbidities anemia, HTN, hx of hysterectomy,    Examination-Activity Limitations Continence;Toileting;Other    Examination-Participation Restrictions Interpersonal Relationship    Stability/Clinical Decision Making Evolving/Moderate complexity    Rehab Potential Fair    PT Frequency 1x / week    PT Duration 8 weeks    PT Treatment/Interventions ADLs/Self Care Home Management;Biofeedback;Moist Heat;Cryotherapy;Electrical Stimulation;Therapeutic exercise;Neuromuscular re-education;Therapeutic activities;Scar mobilization;Taping;Manual techniques;Patient/family education;Ultrasound;DME Instruction;Spinal Manipulations;Joint Manipulations    PT  Next Visit Plan genital hygiene education; gentle spinal mobility; log roll    PT Home Exercise Plan toileting posture; scar massage    Consulted and Agree with Plan of Care Patient           Patient will benefit from skilled therapeutic intervention in order to improve the following deficits and impairments:  Increased muscle spasms,Improper body mechanics,Postural dysfunction,Pain,Impaired flexibility,Increased fascial restricitons,Decreased scar mobility,Decreased coordination,Decreased endurance  Visit Diagnosis: Other muscle spasm  Other lack of coordination     Problem List Patient Active Problem List   Diagnosis Date Noted  . Headache 12/05/2019  . Hypertension 12/02/2019  . Low back pain  05/20/2017  . Burning sensation of feet 03/04/2017  . Left arm numbness 03/04/2017  . Nerve damage 01/01/2017  . Gastroesophageal reflux disease without esophagitis 06/26/2016  . Anemia, iron deficiency 10/04/2015   Myles Gip PT, DPT [phone]  05/04/2020, 9:02 AM  [facility] [address] [place], [place], [address] Phone: [phone]   Fax:  [phone]  Name: Pearley Millington MRN: [id number] Date of Birth: Dec 28, 1955

## 2020-05-11 ENCOUNTER — Encounter: Payer: Self-pay | Admitting: Physical Therapy

## 2020-05-11 ENCOUNTER — Other Ambulatory Visit: Payer: Self-pay

## 2020-05-11 ENCOUNTER — Ambulatory Visit: Payer: Medicare Other | Admitting: Physical Therapy

## 2020-05-11 DIAGNOSIS — R278 Other lack of coordination: Secondary | ICD-10-CM

## 2020-05-11 DIAGNOSIS — M62838 Other muscle spasm: Secondary | ICD-10-CM

## 2020-05-11 NOTE — Therapy (Signed)
[facility] [address] [place], [place], [address] Phone: [phone]   Fax:  [phone]  Physical Therapy Treatment  Patient Details  Name: Erica Peters MRN: [id number] Date of Birth: Sep 20, 1955 Referring Provider (PT): Florian Buff   Encounter Date: 05/11/2020   PT End of Session - 05/11/20 0836    Visit Number 5    Number of Visits 8    Date for PT Re-Evaluation 06/08/20    PT Start Time 0800    PT Stop Time 0830    PT Time Calculation (min) 30 min    Activity Tolerance Patient tolerated treatment well    Behavior During Therapy [facility] for tasks assessed/performed           Past Medical History:  Diagnosis Date  . Anemia   . Hypertension     Past Surgical History:  Procedure Laterality Date  . ABDOMINAL HYSTERECTOMY    . BREAST BIOPSY Left 2002   neg  . BREAST CYST ASPIRATION Left 2000   neg  . FLAT FOOT RECONSTRUCTION-TAL GASTROC RECESSION Right 01/26/2016   Procedure: FLAT FOOT RECONSTRUCTION-TAL GASTROC RECESSION;  Surgeon: Gwyneth Revels, DPM;  Location: [facility];  Service: Podiatry;  Laterality: Right;  . FOOT ARTHRODESIS Right 01/26/2016   Procedure: ARTHRODESIS FOOT;  Surgeon: Gwyneth Revels, DPM;  Location: [facility];  Service: Podiatry;  Laterality: Right;  . HEMORRHOID SURGERY    . TRIPLE SUBTALAR FUSION Right 01/26/2016   Procedure: TRIPLE SUBTALAR FUSION;  Surgeon: Gwyneth Revels, DPM;  Location: [facility];  Service: Podiatry;  Laterality: Right;  Marland Kitchen VARICOSE VEIN SURGERY      There were no vitals filed for this visit.   Subjective Assessment - 05/11/20 0802    Subjective Patient has returned to using elliptical for 15 min/day and has remained pain free. Patient notes that she does not feel pressure if she remains at level 1-2 intensity, but if she goes above that, she feels heaviness in the lower abdomen and pelvis. Patient is able to sit in clinic without donut cushion and indicates no pain. Patient  continues to have good control of bladder during waking hours and is only emptying 1-2x/night over the past week. Patient requesting to leave at 830A in order to provide care for her grandson.    Patient is accompained by: Interpreter   Estill Bamberg, CAP   Currently in Pain? No/denies           TREATMENT Neuromuscular Re-education: Patient educated on the purpose of the pelvic exam and articulated understanding; patient consented to the exam verbally. Supine hooklying diaphragmatic breathing with VCs and TCs for downregulation of the nervous system and improved management of IAP Pudendal nerve desensitization/re-education with use of q-tip. Dull/fine point discrimination utilized as tolerated. Patient able to articulate location of touch with 75% accuracy.    Patient educated throughout session on appropriate technique and form using multi-modal cueing, HEP, and activity modification. Patient articulated understanding and returned demonstration.  Patient Response to interventions: Patient denies increased pain.  ASSESSMENT Patient presents to clinic with excellent motivation to participate in therapy. Patient demonstrates deficits in IAP management, PFM extensibility, PFM coordination, PFM strength, posture, and pain. Patient participated in limited session with pudendal nerve desensitization/re-education during today's session and responded without pain. However, patient was apparently nervous/anxious with education prior to exam. Patient enthusiastically consented to participation in vulvar/vaginal exam, but sat up abruptly after pudendal nerve re-education and reported she could feel the PFM becoming tense. At this  point, patient and DPT agreed to discontinue the session and reassess at next visit to tolerance with the objective of internal PFM assessment as tolerated and with pain controlled. Patient will benefit from continued skilled therapeutic intervention to address remaining deficits in  IAP management, PFM extensibility, PFM coordination, PFM strength, posture, and pain in order to increase function and improve overall QOL.     PT Long Term Goals - 04/13/20 0949      PT LONG TERM GOAL #1   Title Patient will demonstrate independence with HEP in order to maximize therapeutic gains and improve carryover from physical therapy sessions to ADLs in the home and community.    Baseline IE: not demonstated    Time 8    Period Weeks    Status New    Target Date 06/08/20      PT LONG TERM GOAL #2   Title Patient will demonstrate improved function as evidenced by a score of 63 on FOTO Urinary measure for full participation in activities at home and in the community.    Baseline IE: 60    Time 8    Period Weeks    Status New    Target Date 06/08/20      PT LONG TERM GOAL #3   Title Patient will demonstrate improved function as evidenced by a score of <45 on FOTO: PFDI Pain measure for full participation in activities at home and in the community.    Baseline IE: 67    Time 8    Period Weeks    Status New    Target Date 06/08/20      PT LONG TERM GOAL #4   Title Patient will decrease worst pain as reported on NPRS by at least 2 points with self-reported decreased frequency to demonstrate clinically significant reduction in pain in order to restore/improve function and overall QOL.    Baseline IE: 10/10    Time 8    Period Weeks    Status New    Target Date 06/08/20      PT LONG TERM GOAL #5   Title Patient will report being able to return to activities including, but not limited to: gynecological exam, partner intimacy, and elliptical use without pain or limitation to indicate complete resolution of the chief complaint and return to prior level of participation at home and in the community.    Baseline IE: exam 8/10, penetration 10/10, elliptical 7/10    Time 8    Period Weeks    Status New    Target Date 06/08/20                 Plan - 05/11/20 0836     Clinical Impression Statement Patient presents to clinic with excellent motivation to participate in therapy. Patient demonstrates deficits in IAP management, PFM extensibility, PFM coordination, PFM strength, posture, and pain. Patient participated in limited session with pudendal nerve desensitization/re-education during today's session and responded without pain. However, patient was apparently nervous/anxious with education prior to exam. Patient enthusiastically consented to participation in vulvar/vaginal exam, but sat up abruptly after pudendal nerve re-education and reported she could feel the PFM becoming tense. At this point, patient and DPT agreed to discontinue the session and reassess at next visit to tolerance with the objective of internal PFM assessment as tolerated and with pain controlled. Patient will benefit from continued skilled therapeutic intervention to address remaining deficits in IAP management, PFM extensibility, PFM coordination, PFM strength, posture, and pain in  order to increase function and improve overall QOL.    Personal Factors and Comorbidities Age;Behavior Pattern;Comorbidity 3+;Past/Current Experience;Time since onset of injury/illness/exacerbation    Comorbidities anemia, HTN, hx of hysterectomy,    Examination-Activity Limitations Continence;Toileting;Other    Examination-Participation Restrictions Interpersonal Relationship    Stability/Clinical Decision Making Evolving/Moderate complexity    Rehab Potential Fair    PT Frequency 1x / week    PT Duration 8 weeks    PT Treatment/Interventions ADLs/Self Care Home Management;Biofeedback;Moist Heat;Cryotherapy;Electrical Stimulation;Therapeutic exercise;Neuromuscular re-education;Therapeutic activities;Scar mobilization;Taping;Manual techniques;Patient/family education;Ultrasound;DME Instruction;Spinal Manipulations;Joint Manipulations    PT Next Visit Plan genital hygiene education; gentle spinal mobility; log roll     PT Home Exercise Plan toileting posture; scar massage    Consulted and Agree with Plan of Care Patient           Patient will benefit from skilled therapeutic intervention in order to improve the following deficits and impairments:  Increased muscle spasms,Improper body mechanics,Postural dysfunction,Pain,Impaired flexibility,Increased fascial restricitons,Decreased scar mobility,Decreased coordination,Decreased endurance  Visit Diagnosis: Other muscle spasm  Other lack of coordination     Problem List Patient Active Problem List   Diagnosis Date Noted  . Headache 12/05/2019  . Hypertension 12/02/2019  . Low back pain 05/20/2017  . Burning sensation of feet 03/04/2017  . Left arm numbness 03/04/2017  . Nerve damage 01/01/2017  . Gastroesophageal reflux disease without esophagitis 06/26/2016  . Anemia, iron deficiency 10/04/2015   Myles Gip PT, DPT [phone]  05/11/2020, 9:35 AM  [place] [facility] [address] [place], [place], [address] Phone: [phone]   Fax:  [phone]  Name: Erica Peters MRN: [id number] Date of Birth: 07/10/1955

## 2020-05-18 ENCOUNTER — Encounter: Payer: Self-pay | Admitting: Physical Therapy

## 2020-05-18 ENCOUNTER — Ambulatory Visit: Payer: Medicare Other | Admitting: Physical Therapy

## 2020-05-18 ENCOUNTER — Other Ambulatory Visit: Payer: Self-pay

## 2020-05-18 DIAGNOSIS — M62838 Other muscle spasm: Secondary | ICD-10-CM

## 2020-05-18 DIAGNOSIS — R278 Other lack of coordination: Secondary | ICD-10-CM

## 2020-05-18 NOTE — Therapy (Signed)
[facility] [address] [place], [place], [address] Phone: [phone]   Fax:  [phone]  Physical Therapy Treatment  Patient Details  Name: Erica Peters MRN: [id number] Date of Birth: 1955-08-16 Referring Provider (PT): Wannetta Sender   Encounter Date: 05/18/2020   PT End of Session - 05/18/20 0758    Visit Number 6    Number of Visits 8    Date for PT Re-Evaluation 06/08/20    PT Start Time 0800    PT Stop Time 0840    PT Time Calculation (min) 40 min    Activity Tolerance Patient tolerated treatment well    Behavior During Therapy [facility] for tasks assessed/performed           Past Medical History:  Diagnosis Date  . Anemia   . Hypertension     Past Surgical History:  Procedure Laterality Date  . ABDOMINAL HYSTERECTOMY    . BREAST BIOPSY Left 2002   neg  . BREAST CYST ASPIRATION Left 2000   neg  . FLAT FOOT RECONSTRUCTION-TAL GASTROC RECESSION Right 01/26/2016   Procedure: FLAT FOOT RECONSTRUCTION-TAL GASTROC RECESSION;  Surgeon: Samara Deist, DPM;  Location: [facility];  Service: Podiatry;  Laterality: Right;  . FOOT ARTHRODESIS Right 01/26/2016   Procedure: ARTHRODESIS FOOT;  Surgeon: Samara Deist, DPM;  Location: [facility];  Service: Podiatry;  Laterality: Right;  . HEMORRHOID SURGERY    . TRIPLE SUBTALAR FUSION Right 01/26/2016   Procedure: TRIPLE SUBTALAR FUSION;  Surgeon: Samara Deist, DPM;  Location: [facility];  Service: Podiatry;  Laterality: Right;  Marland Kitchen VARICOSE VEIN SURGERY      There were no vitals filed for this visit.   Subjective Assessment - 05/18/20 0803    Subjective Patient states that she continues to do well and had no soreness after nerve re-education last session. Patient continues to use elliptical and has worked up to 25 minutes without pain. Patient has not attempted intimacy with her partner and articulates that she is not nervous, but is rather waiting to see the doctor prior to  resuming. Patient is weaning off the donut cushion as tolerated and without incident. Nocturia continuse to be 1-2x/night. Patient denies any UI during waking hours including during activities such as lifting her grandson. Patient also denies any heaviness or pressure. Patient requesting to leave by 840 in order to provide care for her grandson.    Patient is accompained by: Interpreter   Gildardo Cranker, CAP   Currently in Pain? No/denies          TREATMENT  Manual Therapy: Patient educated on the purpose of the pelvic exam and articulated understanding; patient consented to the exam verbally. INTERNAL VAGINAL EXAM: deferred 2/2 to time constraints Introitus Appears:  No redness or irritation noted Skin integrity: WNL Symmetry: increased tone on R with greatest point of tension at 3 o'clock. Gentle TPR performed at this location with coached diaphragmatic breathing throughout for improved mm relaxation. Palpation: no TTP, but patient able to sense when muscles are overactive or increase in resting tone with palpation. Patient tolerated palpation of iliococcygeus without pain.  Prolapse: none noted in supine hooklying at rest; deferred assessment 2/2 to patient's articulation of increased anxiety/nervousness/fear when she feels prolapse at home with hygiene.  Neuromuscular Re-education: Supine hooklying diaphragmatic breathing with VCs and TCs for downregulation of the nervous system and improved management of IAP Supine hooklying, PFM lengthening with inhalation. VCs and TCs to decrease compensatory patterns and encourage optimal relaxation of  the PFM.  Patient educated throughout session on appropriate technique and form using multi-modal cueing, HEP, and activity modification. Patient articulated understanding and returned demonstration.  Patient Response to interventions: Patient no increased pain after manual interventions.  ASSESSMENT Patient presents to clinic with excellent  motivation to participate in therapy. Patient demonstrates deficits in IAP management, PFM extensibility, PFM coordination, PFM strength, posture, and pain. Patient able to tolerate internal assessment and internal TPR interventions without onset of pain during today's session and responded positively to manual interventions. Patient will benefit from continued skilled therapeutic intervention to address remaining deficits in IAP management, PFM extensibility, PFM coordination, PFM strength, posture, and pain in order to increase function and improve overall QOL.    PT Long Term Goals - 04/13/20 0949      PT LONG TERM GOAL #1   Title Patient will demonstrate independence with HEP in order to maximize therapeutic gains and improve carryover from physical therapy sessions to ADLs in the home and community.    Baseline IE: not demonstated    Time 8    Period Weeks    Status New    Target Date 06/08/20      PT LONG TERM GOAL #2   Title Patient will demonstrate improved function as evidenced by a score of 63 on FOTO Urinary measure for full participation in activities at home and in the community.    Baseline IE: 60    Time 8    Period Weeks    Status New    Target Date 06/08/20      PT LONG TERM GOAL #3   Title Patient will demonstrate improved function as evidenced by a score of <45 on FOTO: PFDI Pain measure for full participation in activities at home and in the community.    Baseline IE: 67    Time 8    Period Weeks    Status New    Target Date 06/08/20      PT LONG TERM GOAL #4   Title Patient will decrease worst pain as reported on NPRS by at least 2 points with self-reported decreased frequency to demonstrate clinically significant reduction in pain in order to restore/improve function and overall QOL.    Baseline IE: 10/10    Time 8    Period Weeks    Status New    Target Date 06/08/20      PT LONG TERM GOAL #5   Title Patient will report being able to return to activities  including, but not limited to: gynecological exam, partner intimacy, and elliptical use without pain or limitation to indicate complete resolution of the chief complaint and return to prior level of participation at home and in the community.    Baseline IE: exam 8/10, penetration 10/10, elliptical 7/10    Time 8    Period Weeks    Status New    Target Date 06/08/20                 Plan - 05/18/20 0758    Clinical Impression Statement Patient presents to clinic with excellent motivation to participate in therapy. Patient demonstrates deficits in IAP management, PFM extensibility, PFM coordination, PFM strength, posture, and pain. Patient able to tolerate internal assessment and internal TPR interventions without onset of pain during today's session and responded positively to manual interventions. Patient will benefit from continued skilled therapeutic intervention to address remaining deficits in IAP management, PFM extensibility, PFM coordination, PFM strength, posture, and pain in order  to increase function and improve overall QOL.    Personal Factors and Comorbidities Age;Behavior Pattern;Comorbidity 3+;Past/Current Experience;Time since onset of injury/illness/exacerbation    Comorbidities anemia, HTN, hx of hysterectomy,    Examination-Activity Limitations Continence;Toileting;Other    Examination-Participation Restrictions Interpersonal Relationship    Stability/Clinical Decision Making Evolving/Moderate complexity    Rehab Potential Fair    PT Frequency 1x / week    PT Duration 8 weeks    PT Treatment/Interventions ADLs/Self Care Home Management;Biofeedback;Moist Heat;Cryotherapy;Electrical Stimulation;Therapeutic exercise;Neuromuscular re-education;Therapeutic activities;Scar mobilization;Taping;Manual techniques;Patient/family education;Ultrasound;DME Instruction;Spinal Manipulations;Joint Manipulations    PT Next Visit Plan genital hygiene education; gentle spinal mobility; log  roll    PT Home Exercise Plan toileting posture; scar massage    Consulted and Agree with Plan of Care Patient           Patient will benefit from skilled therapeutic intervention in order to improve the following deficits and impairments:  Increased muscle spasms,Improper body mechanics,Postural dysfunction,Pain,Impaired flexibility,Increased fascial restricitons,Decreased scar mobility,Decreased coordination,Decreased endurance  Visit Diagnosis: Other muscle spasm  Other lack of coordination     Problem List Patient Active Problem List   Diagnosis Date Noted  . Headache 12/05/2019  . Hypertension 12/02/2019  . Low back pain 05/20/2017  . Burning sensation of feet 03/04/2017  . Left arm numbness 03/04/2017  . Nerve damage 01/01/2017  . Gastroesophageal reflux disease without esophagitis 06/26/2016  . Anemia, iron deficiency 10/04/2015   Myles Gip PT, DPT [phone]  05/18/2020, 9:48 AM  [place] [facility] [address] [place], [place], [address] Phone: [phone]   Fax:  [phone]  Name: Erica Peters MRN: [id number] Date of Birth: December 10, 1955

## 2020-05-25 ENCOUNTER — Other Ambulatory Visit: Payer: Self-pay

## 2020-05-25 ENCOUNTER — Encounter: Payer: Self-pay | Admitting: Physical Therapy

## 2020-05-25 ENCOUNTER — Ambulatory Visit: Payer: Medicare Other | Attending: Obstetrics and Gynecology | Admitting: Physical Therapy

## 2020-05-25 DIAGNOSIS — R278 Other lack of coordination: Secondary | ICD-10-CM | POA: Diagnosis present

## 2020-05-25 DIAGNOSIS — M62838 Other muscle spasm: Secondary | ICD-10-CM | POA: Diagnosis not present

## 2020-05-25 NOTE — Therapy (Addendum)
[facility] [address] [place], [place], [address] Phone: [phone]   Fax:  [phone]  Physical Therapy Treatment  Patient Details  Name: Erica Peters MRN: [id number] Date of Birth: 02-Mar-1956 Referring Provider (PT): Wannetta Sender   Encounter Date: 05/25/2020   PT End of Session - 05/25/20 0759    Visit Number 7    Number of Visits 8    Date for PT Re-Evaluation 06/08/20    PT Start Time 0800    PT Stop Time 0840    PT Time Calculation (min) 40 min    Activity Tolerance Patient tolerated treatment well    Behavior During Therapy [facility] for tasks assessed/performed           Past Medical History:  Diagnosis Date  . Anemia   . Hypertension     Past Surgical History:  Procedure Laterality Date  . ABDOMINAL HYSTERECTOMY    . BREAST BIOPSY Left 2002   neg  . BREAST CYST ASPIRATION Left 2000   neg  . FLAT FOOT RECONSTRUCTION-TAL GASTROC RECESSION Right 01/26/2016   Procedure: FLAT FOOT RECONSTRUCTION-TAL GASTROC RECESSION;  Surgeon: Samara Deist, DPM;  Location: [facility] ORS;  Service: Podiatry;  Laterality: Right;  . FOOT ARTHRODESIS Right 01/26/2016   Procedure: ARTHRODESIS FOOT;  Surgeon: Samara Deist, DPM;  Location: [facility];  Service: Podiatry;  Laterality: Right;  . HEMORRHOID SURGERY    . TRIPLE SUBTALAR FUSION Right 01/26/2016   Procedure: TRIPLE SUBTALAR FUSION;  Surgeon: Samara Deist, DPM;  Location: [facility];  Service: Podiatry;  Laterality: Right;  Marland Kitchen VARICOSE VEIN SURGERY      There were no vitals filed for this visit.   Subjective Assessment - 05/25/20 0803    Subjective Patient consents to be seen without an interpreter. Patient states that she did not have any pain after last session with internal assessment of PFM. Patient follows up with MD on Monday 05/29/20 regarding surgery. Patient continues to report no incidents of UI since last session. Patient is amenable to tentatively discharge after  today's session unless otherwise indicated by MD visit. Patient needs to leave early to care for her grandson.    Patient is accompained by: --    Currently in Pain? No/denies           TREATMENT Neuromuscular Re-education: Supine hooklying diaphragmatic breathing with VCs and TCs for downregulation of the nervous system and improved management of IAP Supine hooklying, PFM lengthening with inhalation. VCs and TCs to decrease compensatory patterns and encourage optimal relaxation of the PFM. Supine knee to chest with PFM lengthening, BLE, for improved PFM spasm release Supine double knee to chest with PFM lengthening for improved PFM spasm release Supine butterfly with PFM lengthening, BLE, for improved PFM tissue length  Patient educated throughout session on appropriate technique and form using multi-modal cueing, HEP, and activity modification. Patient articulated understanding and returned demonstration.  Patient Response to interventions: Patient no increased pain at end of session. Patient comfortable to follow-up as needed after appointment with MD.   ASSESSMENT Patient presents to clinic with excellent motivation to participate in therapy. Patient demonstrates minimal remaining deficits in IAP management, PFM extensibility, PFM coordination, PFM strength, posture, and pain. Patient indicating achievement of four out of five goals during today's session with clinically significant improvement in PFDI Pain score. Patient has responded positively to neuromuscular re-education and manual interventions throughout the episode of care. While PFM tension and strength deficits remain and there continues to be  a pelvic organ support deficit, patient has achieved her stated goal of pain control as a prerequisite for surgical intervention. Patient has not had any UI in > 3 weeks and has resumed personal fitness program without adverse effect. Patient may benefit from further PFPT to address issues of  PFM strength and coordination in order to maintain surgical outcomes if surgical intervention is pursued. At this time patient is appropriate for a trial discharge period to self-manage IAP management, PFM extensibility, PFM coordination, PFM strength, posture, and pain in order to increase function and improve overall QOL.     PT Long Term Goals - 05/25/20 0807      PT LONG TERM GOAL #1   Title Patient will demonstrate independence with HEP in order to maximize therapeutic gains and improve carryover from physical therapy sessions to ADLs in the home and community.    Baseline IE: not demonstated; 2/3: IND    Time 8    Period Weeks    Status Achieved      PT LONG TERM GOAL #2   Title Patient will demonstrate improved function as evidenced by a score of 63 on FOTO Urinary measure for full participation in activities at home and in the community.    Baseline IE: 60; 1/13: 72    Time 8    Period Weeks    Status Achieved      PT LONG TERM GOAL #3   Title Patient will demonstrate improved function as evidenced by a score of <45 on FOTO: PFDI Pain measure for full participation in activities at home and in the community.    Baseline IE: 67; 2/3: 50    Time 8    Period Weeks    Status Partially Met      PT LONG TERM GOAL #4   Title Patient will decrease worst pain as reported on NPRS by at least 2 points with self-reported decreased frequency to demonstrate clinically significant reduction in pain in order to restore/improve function and overall QOL.    Baseline IE: 10/10; 2/3: 0/10    Time 8    Period Weeks    Status Achieved      PT LONG TERM GOAL #5   Title Patient will report being able to return to activities including, but not limited to: gynecological exam, partner intimacy, and elliptical use without pain or limitation to indicate complete resolution of the chief complaint and return to prior level of participation at home and in the community.    Baseline IE: exam 8/10,  penetration 10/10, elliptical 7/10; 2/3: 0/10 exam, 0/10 elliptical, has not resumed sexual penetration    Time 8    Period Weeks    Status Achieved                 Plan - 05/25/20 0806    Clinical Impression Statement Patient presents to clinic with excellent motivation to participate in therapy. Patient demonstrates minimal remaining deficits in IAP management, PFM extensibility, PFM coordination, PFM strength, posture, and pain. Patient indicating achievement of four out of five goals during today's session with clinically significant improvement in PFDI Pain score. Patient has responded positively to neuromuscular re-education and manual interventions throughout the episode of care. While PFM tension and strength deficits remain and there continues to be a pelvic organ support deficit, patient has achieved her stated goal of pain control as a prerequisite for surgical intervention. Patient has not had any UI in > 3 weeks and has resumed personal  fitness program without adverse effect. Patient may benefit from further PFPT to address issues of PFM strength and coordination in order to maintain surgical outcomes if surgical intervention is pursued. At this time patient is appropriate for a trial discharge period to self-manage IAP management, PFM extensibility, PFM coordination, PFM strength, posture, and pain in order to increase function and improve overall QOL.    Personal Factors and Comorbidities Age;Behavior Pattern;Comorbidity 3+;Past/Current Experience;Time since onset of injury/illness/exacerbation    Comorbidities anemia, HTN, hx of hysterectomy,    Examination-Activity Limitations Continence;Toileting;Other    Examination-Participation Restrictions Interpersonal Relationship    Stability/Clinical Decision Making Evolving/Moderate complexity    Rehab Potential Fair    PT Frequency 1x / week    PT Duration 8 weeks    PT Treatment/Interventions ADLs/Self Care Home  Management;Biofeedback;Moist Heat;Cryotherapy;Electrical Stimulation;Therapeutic exercise;Neuromuscular re-education;Therapeutic activities;Scar mobilization;Taping;Manual techniques;Patient/family education;Ultrasound;DME Instruction;Spinal Manipulations;Joint Manipulations    PT Next Visit Plan genital hygiene education; gentle spinal mobility; log roll    PT Home Exercise Plan toileting posture; scar massage    Consulted and Agree with Plan of Care Patient           Patient will benefit from skilled therapeutic intervention in order to improve the following deficits and impairments:  Increased muscle spasms,Improper body mechanics,Postural dysfunction,Pain,Impaired flexibility,Increased fascial restricitons,Decreased scar mobility,Decreased coordination,Decreased endurance  Visit Diagnosis: Other muscle spasm  Other lack of coordination     Problem List Patient Active Problem List   Diagnosis Date Noted  . Headache 12/05/2019  . Hypertension 12/02/2019  . Low back pain 05/20/2017  . Burning sensation of feet 03/04/2017  . Left arm numbness 03/04/2017  . Nerve damage 01/01/2017  . Gastroesophageal reflux disease without esophagitis 06/26/2016  . Anemia, iron deficiency 10/04/2015   Myles Gip PT, DPT [phone]  05/25/2020, 9:05 AM  [place] [address] [place], [place], [address] Phone: [phone]   Fax:  [phone]  Name: Maebry Obrien MRN: [id number] Date of Birth: 12/31/1955

## 2020-05-26 NOTE — Progress Notes (Signed)
[facility] Return Visit  SUBJECTIVE  History of Present Illness: Erica Peters is a 65 y.o. female seen in follow-up for levator spasm, dyspareunia, prolapse and incontinence. Plan at last visit was to start pelvic floor physical therapy.   Since last visit, she has had 7 visits to physical therapy. Pain has improved greatly. Urgency and frequency has also improved. Still occasionally leaks urine small amounts.  Still has not had intercourse due to the prolapse.   Has been using the estrogen cream and the coconut oil in between. Feels that she has had good improvement.   She is interested in pursuing surgery for prolpase and incontinence.    Past Medical History: Patient  has a past medical history of Anemia and Hypertension.   Past Surgical History: She  has a past surgical history that includes Breast biopsy (Left, 2002); Breast cyst aspiration (Left, 2000); Abdominal hysterectomy; Hemorrhoid surgery; Varicose vein surgery; Flat foot reconstruction-tal gastroc recession (Right, 01/26/2016); Foot arthrodesis (Right, 01/26/2016); and Triple subtalar fusion (Right, 01/26/2016).   Medications: She has a current medication list which includes the following prescription(s): amlodipine, estradiol, gabapentin, pantoprazole, sennosides, ferrous sulfate, misc natural products, oxycodone-acetaminophen, prednisone, and tramadol.   Allergies: Patient has No Known Allergies.   Social History: Patient  reports that she has never smoked. She has never used smokeless tobacco. She reports that she does not drink alcohol and does not use drugs.      OBJECTIVE     Physical Exam: Vitals:   05/29/20 1526  BP: (!) 158/81  Pulse: 84  Weight: 158 lb (71.7 kg)   Gen: No apparent distress, A&O x 3.  Detailed Urogynecologic Evaluation:  Deferred. Prior exam showed (02/28/20):   POP-Q  -2                                            Aa   -2                                            Ba  -5                                              C   2.5                                            Gh  5                                            Pb  7                                            tvl   0  Ap  0                                            Bp   (N/A)                                              D       ASSESSMENT AND PLAN    Erica Peters is a 65 y.o. with:  1. Prolapse of posterior vaginal wall   2. SUI (stress urinary incontinence, female)   3. Urge incontinence   4. Levator spasm   5. Vaginal atrophy     1. Prolapse - Discussed conservative and surgical options for prolapse, and she would like to proceed with surgery. Potential plan for surgery will be posterior repair, possible sacrospinous ligament fixation. Handouts provided about the surgery.   2. Stress Incontinence - Will proceed with urodynamic testing to confirm incontinence. She would be interested in a concomitant sling at the time of prolapse repair.   3. Urgency/ frequency - has improved with pelvic floor PT  4. Levator spasm - Pain has improved significantly with PT. She is interested in resuming after the surgery if needed.   5. Vaginal atrophy - continue using estrace cream twice weekly and coconut oil in between as needed.   Return for urodynamic testing.   Jaquita Folds, MD  Time spent: I spent 25 minutes dedicated to the care of this patient on the date of this encounter to include pre-visit review of records, face-to-face time with the patient and post visit documentation.

## 2020-05-29 ENCOUNTER — Encounter: Payer: Self-pay | Admitting: Obstetrics and Gynecology

## 2020-05-29 ENCOUNTER — Ambulatory Visit (INDEPENDENT_AMBULATORY_CARE_PROVIDER_SITE_OTHER): Payer: Medicare Other | Admitting: Obstetrics and Gynecology

## 2020-05-29 VITALS — BP 158/81 | HR 84 | Wt 158.0 lb

## 2020-05-29 DIAGNOSIS — M62838 Other muscle spasm: Secondary | ICD-10-CM | POA: Diagnosis not present

## 2020-05-29 DIAGNOSIS — N816 Rectocele: Secondary | ICD-10-CM | POA: Diagnosis not present

## 2020-05-29 DIAGNOSIS — N3941 Urge incontinence: Secondary | ICD-10-CM

## 2020-05-29 DIAGNOSIS — N393 Stress incontinence (female) (male): Secondary | ICD-10-CM | POA: Diagnosis not present

## 2020-05-29 DIAGNOSIS — N952 Postmenopausal atrophic vaginitis: Secondary | ICD-10-CM

## 2020-06-01 ENCOUNTER — Encounter: Payer: 59 | Admitting: Physical Therapy

## 2020-06-08 ENCOUNTER — Encounter: Payer: 59 | Admitting: Physical Therapy

## 2020-06-15 ENCOUNTER — Encounter: Payer: 59 | Admitting: Physical Therapy

## 2020-06-29 NOTE — Progress Notes (Signed)
[facility] Urodynamics Procedure  Referring Physician: [place] Date of Procedure: 06/30/2020  Erica Peters is a 65 y.o. female who presents for urodynamic evaluation. Indication(s) for study: mixed incontinence  Vital Signs: BP (!) 163/81   Pulse 75   Wt 158 lb (71.7 kg)   BMI 31.91 kg/m   Laboratory Results: A catheterized urine specimen revealed:  POC urine: negative  Voiding Diary: Not performed  Procedure Timeout:  The correct patient was verified and the correct procedure was verified. The patient was in the correct position and safety precautions were reviewed based on at the patient's history. The timeout was performed.  Urodynamic Procedure A 4F dual lumen urodynamics catheter was placed under sterile conditions into the patient's bladder. A 4F catheter was placed into the rectum in order to measure abdominal pressure. EMG patches were placed in the appropriate position.  All connections were confirmed and calibrations/adjusted made. Saline was instilled into the bladder through the dual lumen catheters.  Cough/valsalva pressures were measured periodically during filling.  Patient was allowed to void.  The bladder was then emptied of its residual.  UROFLOW: Revealed a Qmax of 7.5 mL/sec.  She voided 93 mL and had a residual of 60 mL.  It was a normal pattern and represented normal habits though interpretation limited due to low voided volume.  CMG: This was performed with sterile water in the sitting position at a fill rate of 30 mL/min.    First sensation of fullness was 75 mLs,  First urge was 101 mLs,  Strong urge was 173 mLs and  Capacity was 268 mLs  Stress incontinence was not demonstrated Highest negative Barrier CLPP was 125 cmH20 at capacity Highest negative Barrier VLPP was 86 cmH20 at capacity  Detrusor function was overactive, with phasic contractions seen.  The first occurred at capacity to 23 cm of water and was  associated with leakage (lost approximately 93ml urine).  Compliance:  normal. End fill detrusor pressure was 6cmH20.  Calculated compliance was 67mL/cmH20  UPP: MUCP with/ without barrier reduction was 42 cm of water.    MICTURITION STUDY: Voiding was performed with reduction using scopettes in the sitting position.  Pdet at Qmax was 26.5 cm of water.  Qmax was 8.9 mL/sec.  It was a normal pattern.  She voided 154 mL and had a residual of 75 mL.  It was a volitional void, sustained detrusor contraction was present and abdominal straining was not present. She had a second small void after the first.   EMG: This was performed with patches.  She had voluntary contractions, recruitment with fill was present and urethral sphincter was relaxed with void.  The details of the procedure with the study tracings have been scanned into EPIC.   Urodynamic Impression:  1. Sensation was increased; capacity was reduced 2. Stress Incontinence was not demonstrated; 3. Detrusor Overactivity was demonstrated with leakage. 4. Emptying was normal with a normal PVR, a sustained detrusor contraction present,  abdominal straining not present, normal urethral sphincter activity on EMG.  Plan: - The patient will follow up  to discuss the findings and treatment options.    Jaquita Folds, MD

## 2020-06-30 ENCOUNTER — Ambulatory Visit (INDEPENDENT_AMBULATORY_CARE_PROVIDER_SITE_OTHER): Payer: Medicare Other | Admitting: Obstetrics and Gynecology

## 2020-06-30 ENCOUNTER — Encounter: Payer: Self-pay | Admitting: Obstetrics and Gynecology

## 2020-06-30 ENCOUNTER — Other Ambulatory Visit: Payer: Self-pay

## 2020-06-30 VITALS — BP 163/81 | HR 75 | Wt 158.0 lb

## 2020-06-30 DIAGNOSIS — N3281 Overactive bladder: Secondary | ICD-10-CM

## 2020-06-30 DIAGNOSIS — R350 Frequency of micturition: Secondary | ICD-10-CM | POA: Diagnosis not present

## 2020-06-30 LAB — POCT URINALYSIS DIPSTICK
Appearance: NORMAL
Bilirubin, UA: NEGATIVE
Blood, UA: NEGATIVE
Glucose, UA: NEGATIVE
Ketones, UA: NEGATIVE
Leukocytes, UA: NEGATIVE
Nitrite, UA: NEGATIVE
Protein, UA: NEGATIVE
Spec Grav, UA: 1.01 (ref 1.010–1.025)
Urobilinogen, UA: 0.2 E.U./dL
pH, UA: 6 (ref 5.0–8.0)

## 2020-06-30 NOTE — Patient Instructions (Signed)
Taking Care of Yourself after Urodynamics, Cystoscopy, Coaptite Injection, or Botox Injection   Drink plenty of water for a day or two following your procedure. Try to have about 8 ounces (one cup) at a time, and do this 6 times or more per day unless you have fluid restrictitons AVOID irritative beverages such as coffee, tea, soda, alcoholic or citrus drinks for a day or two, as this may cause burning with urination.  For the first 1-2 days after the procedure, your urine may be pink or red in color. You may have some blood in your urine as a normal side effect of the procedure. Large amounts of bleeding or difficulty urinating are NOT normal. Call the nurse line if this happens or go to the nearest Emergency Room if the bleeding is heavy or you cannot urinate at all and it is after hours.  You may experience some discomfort or a burning sensation with urination after having this procedure. You can use over the counter Azo or pyridium to help with burning and follow the instructions on the packaging. If it does not improve within 1-2 days, or other symptoms appear (fever, chills, or difficulty urinating) call the office to speak to a nurse.  You may return to normal daily activities such as work, school, driving, exercising and housework on the day of the procedure. If your doctor gave you a prescription, take it as ordered.  If you need a return appointment, the front desk staff will arrange it when you check out.   

## 2020-07-04 NOTE — Progress Notes (Unsigned)
[facility] Return Visit  SUBJECTIVE  History of Present Illness: Erica Peters is a 65 y.o. female seen in follow-up after urodynamic testing.   She is interested in proceeding with surgery for her prolapse.   Urodynamic Impression:  1. Sensation was increased; capacity was reduced 2. Stress Incontinence was not demonstrated; 3. Detrusor Overactivity was demonstrated with leakage. 4. Emptying was normal with a normal PVR, a sustained detrusor contraction present,  abdominal straining not present, normal urethral sphincter activity on EMG.   Past Medical History: Patient  has a past medical history of Anemia and Hypertension.   Past Surgical History: She  has a past surgical history that includes Breast biopsy (Left, 2002); Breast cyst aspiration (Left, 2000); Abdominal hysterectomy; Hemorrhoid surgery; Varicose vein surgery; Flat foot reconstruction-tal gastroc recession (Right, 01/26/2016); Foot arthrodesis (Right, 01/26/2016); and Triple subtalar fusion (Right, 01/26/2016).   Medications: She has a current medication list which includes the following prescription(s): amlodipine, estradiol, ferrous sulfate, gabapentin, misc natural products, oxycodone-acetaminophen, pantoprazole, prednisone, sennosides, and tramadol.   Allergies: Patient has No Known Allergies.   Social History: Patient  reports that she has never smoked. She has never used smokeless tobacco. She reports that she does not drink alcohol and does not use drugs.      OBJECTIVE  Gen: No distress, AAOx3 POP-Q  -2 Aa  -2 Ba  -5 C   2.5 Gh  5 Pb  7 tvl   0 Ap   0 Bp  (N/A) D       ASSESSMENT AND PLAN    Erica Peters is a 65 y.o. with:  1. Prolapse of posterior vaginal wall   2. Vaginal vault prolapse after hysterectomy     Plan for surgery: Exam under anesthesia, posterior repair, possible sacrospinous ligament fixation, cystoscopy  - We reviewed the patient's specific anatomic and functional findings, with the assistance of diagrams, and together finalized the above procedure. The planned surgical procedures were discussed along with the surgical risks outlined below, which were also provided on a detailed handout. Additional treatment options including expectant management, conservative management, medical management were discussed where appropriate.  We reviewed the benefits and risks of each treatment option.   General Surgical Risks: For all procedures, there are risks of bleeding, infection, damage to surrounding organs including but not limited to bowel, bladder, blood vessels, ureters and nerves, and need for further surgery if an injury were to occur. These risks are all low with minimally invasive surgery.   There are risks of numbness and weakness at any body site or buttock/rectal pain.  It is possible that baseline pain can be worsened by surgery, either with or without mesh. If surgery is vaginal, there is also a low risk of possible conversion to laparoscopy or open abdominal incision where indicated. Very rare risks include blood transfusion, blood clot, heart attack, pneumonia, or death.   There is also a risk of short-term postoperative urinary retention with need to use a catheter. About half of patients need to go home from surgery with a catheter, which is then later removed in the office. The risk of Erica Peters-term need for a catheter is very low. There is also a risk of worsening of overactive bladder.    Prolapse (with or  without mesh): Risk factors for surgical failure  include things that put pressure on your pelvis and the surgical repair, including obesity, chronic cough, and heavy lifting or straining (including lifting children or adults, straining on the toilet, or  lifting heavy objects such as furniture or anything weighing >25 lbs. Risks of recurrence is 20-30% with vaginal native tissue repair and a less than 10% with sacrocolpopexy with mesh.    - For preop Visit:  She is required to have a visit within 30 days of her surgery.   Today we reviewed pre-operative preparation, peri-operative expectations, and post-operative instructions/recovery.  She was provided with instructional handouts. She understands not to take aspirin (>81mg ) or NSAIDs 7 days prior to surgery. Prescriptions provided for: Oxycodone 5mg  , Ibuprofen 600mg , Tylenol 500mg , Miralax. These prescriptions will be sent prior to surgery.  - Medical clearance: not required  - Anticoagulant use: No - Medicaid Hysterectomy form: No - Accepts blood transfusion: Yes - Expected length of stay: outpatient  Request sent for surgery scheduling.   Jaquita Folds, MD   Time spent: I spent 30 minutes dedicated to the care of this patient on the date of this encounter to include pre-visit review of records, face-to-face time with the patient discussing surgery and post visit documentation.

## 2020-07-05 ENCOUNTER — Other Ambulatory Visit: Payer: Self-pay

## 2020-07-05 ENCOUNTER — Ambulatory Visit (INDEPENDENT_AMBULATORY_CARE_PROVIDER_SITE_OTHER): Payer: Medicare Other | Admitting: Obstetrics and Gynecology

## 2020-07-05 ENCOUNTER — Encounter: Payer: Self-pay | Admitting: Obstetrics and Gynecology

## 2020-07-05 VITALS — BP 166/88 | HR 79 | Wt 158.0 lb

## 2020-07-05 DIAGNOSIS — N816 Rectocele: Secondary | ICD-10-CM

## 2020-07-05 DIAGNOSIS — N993 Prolapse of vaginal vault after hysterectomy: Secondary | ICD-10-CM

## 2020-07-07 ENCOUNTER — Other Ambulatory Visit: Payer: Self-pay | Admitting: Obstetrics and Gynecology

## 2020-07-07 DIAGNOSIS — Z01818 Encounter for other preprocedural examination: Secondary | ICD-10-CM

## 2020-07-07 MED ORDER — POLYETHYLENE GLYCOL 3350 17 GM/SCOOP PO POWD: 1.0000 | Freq: Once | ORAL | 0 refills | Status: AC

## 2020-07-07 MED ORDER — IBUPROFEN 600 MG PO TABS: 600.0000 mg | ORAL_TABLET | Freq: Four times a day (QID) | ORAL | 0 refills | Status: DC | PRN

## 2020-07-07 MED ORDER — OXYCODONE HCL 5 MG PO TABS
5.0000 mg | ORAL_TABLET | ORAL | 0 refills | Status: DC | PRN
Start: 2020-07-07 — End: 2020-08-23

## 2020-07-07 MED ORDER — ACETAMINOPHEN 500 MG PO TABS
500.0000 mg | ORAL_TABLET | Freq: Four times a day (QID) | ORAL | 0 refills | Status: DC | PRN
Start: 2020-07-07 — End: 2020-08-23

## 2020-07-07 NOTE — H&P (Signed)
[facility] Pre-Operative H&P  Subjective Chief Complaint: Calynn Ferrero presents for a preoperative encounter.   History of Present Illness: Lindsea Olivar is a 65 y.o. female who presents for preoperative visit.  She is scheduled to undergo Exam under anesthesia, posterior repair, possible sacrospinous ligament fixation, cystoscopy on 07/17/20.  Her symptoms include vaginal bulge, and she was was found to have Stage I anterior, Stage I posterior, Stage II apical prolapse   Urodynamics showed: 1. Sensation wasincreased; capacity wasreduced 2. Stress Incontinencewas notdemonstrated; 3. Detrusor Overactivitywasdemonstrated withleakage. 4. Emptying wasnormalwith a normalPVR, a sustained detrusor contraction present, abdominal straining not present,normalurethral sphincter activity on EMG.  Past Medical History:  Diagnosis Date  . Anemia   . Hypertension      Past Surgical History:  Procedure Laterality Date  . ABDOMINAL HYSTERECTOMY    . BREAST BIOPSY Left 2002   neg  . BREAST CYST ASPIRATION Left 2000   neg  . FLAT FOOT RECONSTRUCTION-TAL GASTROC RECESSION Right 01/26/2016   Procedure: FLAT FOOT RECONSTRUCTION-TAL GASTROC RECESSION;  Surgeon: Samara Deist, DPM;  Location: [facility];  Service: Podiatry;  Laterality: Right;  . FOOT ARTHRODESIS Right 01/26/2016   Procedure: ARTHRODESIS FOOT;  Surgeon: Samara Deist, DPM;  Location: [facility];  Service: Podiatry;  Laterality: Right;  . HEMORRHOID SURGERY    . TRIPLE SUBTALAR FUSION Right 01/26/2016   Procedure: TRIPLE SUBTALAR FUSION;  Surgeon: Samara Deist, DPM;  Location: [facility];  Service: Podiatry;  Laterality: Right;  Marland Kitchen VARICOSE VEIN SURGERY      has No Known Allergies.   Family History  Problem Relation Age of Onset  . Breast cancer Other     Social History   Tobacco Use  . Smoking status: Never Smoker  . Smokeless tobacco: Never Used  Substance Use Topics  . Alcohol  use: No  . Drug use: No     Review of Systems was negative for a full 10 system review except as noted in the History of Present Illness.  No current facility-administered medications for this encounter.  Current Outpatient Medications:  .  amLODipine (NORVASC) 5 MG tablet, Take 5 mg by mouth daily., Disp: , Rfl:  .  estradiol (ESTRACE) 0.1 MG/GM vaginal cream, Place 0.5 g vaginally 2 (two) times a week. Place 0.5g nightly for two weeks then twice a week after, Disp: 30 g, Rfl: 11 .  ferrous sulfate 325 (65 FE) MG tablet, Take 325 mg by mouth daily with breakfast., Disp: , Rfl:  .  gabapentin (NEURONTIN) 100 MG capsule, Take 100 mg by mouth once., Disp: , Rfl:  .  Misc Natural Products (OSTEO BI-FLEX JOINT SHIELD PO), Take 1 tablet by mouth daily., Disp: , Rfl:  .  oxyCODONE-acetaminophen (ROXICET) 5-325 MG tablet, Take 1-2 tablets by mouth every 4 (four) hours as needed for severe pain., Disp: 30 tablet, Rfl: 0 .  pantoprazole (PROTONIX) 40 MG tablet, Take 40 mg by mouth daily., Disp: , Rfl:  .  predniSONE (DELTASONE) 10 MG tablet, Take 6 pills daily for 5 days, then 30 pills daily for 5 days, Disp: 45 tablet, Rfl: 0 .  Sennosides (PRUNE SENNA CONCENTRATE PO), Take 1 tablet by mouth daily as needed. May increase as necessary, Disp: , Rfl:  .  traMADol (ULTRAM) 50 MG tablet, Take 1 tablet (50 mg total) by mouth every 6 (six) hours as needed., Disp: 15 tablet, Rfl: 0   Objective (Physical Examination (to extent possible); Laboratory and Test Data (as available))  Previous Pelvic Exam  showed: POP-Q  -2 Aa  -2 Ba  -5 C   2.5 Gh  5 Pb  7 tvl   0 Ap   0 Bp  (N/A) D      Assessment/ Plan  Assessment: The patient is a 65 y.o. year old with stage II pelvic organ prolapse.   Plan:  Exam under anesthesia, posterior repair, possible sacrospinous ligament fixation, cystoscopy.   Pre-operative instructions:  She was instructed to not take Aspirin/NSAIDs x 7days prior to surgery.  Antibiotic prophylaxis was ordered as indicated.  Post-operative medications: Prescriptions for motrin, tylenol, miralax, and oxycodone were sent to her pharmacy. Discussed using ibuprofen and tylenol on a schedule to limit use of narcotics.   Laboratory testing:  We will check labs: CBC and EKG.  We also discussed that Covid testing will take place prior to her surgery and will get cancelled if she tests positive.     Jaquita Folds, MD

## 2020-07-13 ENCOUNTER — Other Ambulatory Visit (HOSPITAL_COMMUNITY)
Admission: RE | Admit: 2020-07-13 | Discharge: 2020-07-13 | Disposition: A | Discharge status: Home / Self Care | Payer: Medicare Other | Source: Ambulatory Visit | Attending: Physician Assistant | Admitting: Physician Assistant

## 2020-07-13 ENCOUNTER — Other Ambulatory Visit (HOSPITAL_COMMUNITY)
Admission: RE | Admit: 2020-07-13 | Discharge: 2020-07-13 | Disposition: A | Discharge status: Home / Self Care | Payer: Medicare Other | Source: Ambulatory Visit | Attending: Obstetrics and Gynecology | Admitting: Obstetrics and Gynecology

## 2020-07-13 DIAGNOSIS — Z20822 Contact with and (suspected) exposure to covid-19: Secondary | ICD-10-CM | POA: Diagnosis not present

## 2020-07-13 DIAGNOSIS — Z01812 Encounter for preprocedural laboratory examination: Secondary | ICD-10-CM | POA: Insufficient documentation

## 2020-07-13 LAB — SARS CORONAVIRUS 2 (TAT 6-24 HRS): SARS Coronavirus 2: NEGATIVE

## 2020-07-14 ENCOUNTER — Encounter (HOSPITAL_BASED_OUTPATIENT_CLINIC_OR_DEPARTMENT_OTHER): Payer: Self-pay | Admitting: Obstetrics and Gynecology

## 2020-07-14 ENCOUNTER — Other Ambulatory Visit: Payer: Self-pay

## 2020-07-14 NOTE — Progress Notes (Signed)
Spoke w/ via phone for pre-op interview--- Pt thru [place] interpreter ID# [id number]  Lab needs dos----  CBC, EKG, Istat             Lab results------ no COVID test ------ done 07-13-2020 negative result in epic  Arrive at ------- 0900 on 07-17-2020 NPO after MN NO Solid Food.  Clear liquids from MN until--- 0800 Med rec completed Medications to take morning of surgery ----- Norvasc  Diabetic medication ----- n/a Patient instructed to bring photo id and insurance card day of surgery Patient aware to have Driver (ride ) / caregiver    for 24 hours after surgery-- son, [facility]   Patient Special Instructions ----- n/a Pre-Op special Istructions ----- requested female spanish interpreter via email to interpreting [place]. Patient verbalized understanding of instructions that were given at this phone interview. Patient denies shortness of breath, chest pain, fever, cough at this phone interview.

## 2020-07-17 ENCOUNTER — Encounter (HOSPITAL_BASED_OUTPATIENT_CLINIC_OR_DEPARTMENT_OTHER): Payer: Self-pay | Admitting: Obstetrics and Gynecology

## 2020-07-17 ENCOUNTER — Other Ambulatory Visit: Payer: Self-pay

## 2020-07-17 ENCOUNTER — Telehealth: Payer: Self-pay | Admitting: Obstetrics and Gynecology

## 2020-07-17 ENCOUNTER — Ambulatory Visit (HOSPITAL_BASED_OUTPATIENT_CLINIC_OR_DEPARTMENT_OTHER)
Admission: RE | Admit: 2020-07-17 | Discharge: 2020-07-17 | Disposition: A | Discharge status: Home / Self Care | Payer: Medicare Other | Attending: Obstetrics and Gynecology | Admitting: Obstetrics and Gynecology

## 2020-07-17 ENCOUNTER — Ambulatory Visit (HOSPITAL_BASED_OUTPATIENT_CLINIC_OR_DEPARTMENT_OTHER): Payer: Medicare Other | Admitting: Anesthesiology

## 2020-07-17 ENCOUNTER — Encounter (HOSPITAL_BASED_OUTPATIENT_CLINIC_OR_DEPARTMENT_OTHER)
Admission: RE | Disposition: A | Discharge status: Home / Self Care | Payer: Self-pay | Source: Home / Self Care | Attending: Obstetrics and Gynecology

## 2020-07-17 DIAGNOSIS — N993 Prolapse of vaginal vault after hysterectomy: Secondary | ICD-10-CM | POA: Insufficient documentation

## 2020-07-17 DIAGNOSIS — N816 Rectocele: Secondary | ICD-10-CM | POA: Diagnosis not present

## 2020-07-17 DIAGNOSIS — Z90710 Acquired absence of both cervix and uterus: Secondary | ICD-10-CM | POA: Insufficient documentation

## 2020-07-17 DIAGNOSIS — Z803 Family history of malignant neoplasm of breast: Secondary | ICD-10-CM | POA: Diagnosis not present

## 2020-07-17 DIAGNOSIS — Z79899 Other long term (current) drug therapy: Secondary | ICD-10-CM | POA: Diagnosis not present

## 2020-07-17 HISTORY — PX: RECTOCELE REPAIR: SHX761

## 2020-07-17 HISTORY — DX: Type 2 diabetes mellitus without complications: E11.9

## 2020-07-17 HISTORY — DX: Female genital prolapse, unspecified: N81.9

## 2020-07-17 HISTORY — PX: CYSTOSCOPY: SHX5120

## 2020-07-17 HISTORY — DX: Iron deficiency anemia, unspecified: D50.9

## 2020-07-17 HISTORY — PX: ANTERIOR AND POSTERIOR REPAIR WITH SACROSPINOUS FIXATION: SHX6536

## 2020-07-17 HISTORY — DX: Presence of dental prosthetic device (complete) (partial): Z97.2

## 2020-07-17 HISTORY — DX: Gastro-esophageal reflux disease without esophagitis: K21.9

## 2020-07-17 LAB — CBC
HCT: 44.5 % (ref 36.0–46.0)
Hemoglobin: 14.5 g/dL (ref 12.0–15.0)
MCH: 30.1 pg (ref 26.0–34.0)
MCHC: 32.6 g/dL (ref 30.0–36.0)
MCV: 92.5 fL (ref 80.0–100.0)
Platelets: 304 10*3/uL (ref 150–400)
RBC: 4.81 MIL/uL (ref 3.87–5.11)
RDW: 13.5 % (ref 11.5–15.5)
WBC: 8.2 10*3/uL (ref 4.0–10.5)
nRBC: 0 % (ref 0.0–0.2)

## 2020-07-17 LAB — POCT I-STAT, CHEM 8
BUN: 18 mg/dL (ref 8–23)
Calcium, Ion: 1.21 mmol/L (ref 1.15–1.40)
Chloride: 104 mmol/L (ref 98–111)
Creatinine, Ser: 0.5 mg/dL (ref 0.44–1.00)
Glucose, Bld: 108 mg/dL — ABNORMAL HIGH (ref 70–99)
HCT: 44 % (ref 36.0–46.0)
Hemoglobin: 15 g/dL (ref 12.0–15.0)
Potassium: 3.5 mmol/L (ref 3.5–5.1)
Sodium: 144 mmol/L (ref 135–145)
TCO2: 30 mmol/L (ref 22–32)

## 2020-07-17 LAB — GLUCOSE, CAPILLARY: Glucose-Capillary: 114 mg/dL — ABNORMAL HIGH (ref 70–99)

## 2020-07-17 SURGERY — COLPORRHAPHY, POSTERIOR, FOR RECTOCELE REPAIR
Anesthesia: General | Site: Vagina

## 2020-07-17 MED ORDER — LIDOCAINE 2% (20 MG/ML) 5 ML SYRINGE
INTRAMUSCULAR | Status: DC | PRN
  Administered 2020-07-17: 50 mg via INTRAVENOUS

## 2020-07-17 MED ORDER — MIDAZOLAM HCL 2 MG/2ML IJ SOLN
INTRAMUSCULAR | Status: AC
  Filled 2020-07-17: qty 2

## 2020-07-17 MED ORDER — ONDANSETRON HCL 4 MG/2ML IJ SOLN
INTRAMUSCULAR | Status: AC
  Filled 2020-07-17: qty 2

## 2020-07-17 MED ORDER — PHENAZOPYRIDINE HCL 100 MG PO TABS
100.0000 mg | ORAL_TABLET | ORAL | Status: AC
  Administered 2020-07-17: 100 mg via ORAL

## 2020-07-17 MED ORDER — KETOROLAC TROMETHAMINE 30 MG/ML IJ SOLN
INTRAMUSCULAR | Status: AC
  Filled 2020-07-17: qty 1

## 2020-07-17 MED ORDER — EPHEDRINE 5 MG/ML INJ
INTRAVENOUS | Status: AC
  Filled 2020-07-17: qty 10

## 2020-07-17 MED ORDER — LIDOCAINE-EPINEPHRINE 1 %-1:100000 IJ SOLN
INTRAMUSCULAR | Status: DC | PRN
  Administered 2020-07-17: 16 mL

## 2020-07-17 MED ORDER — FENTANYL CITRATE (PF) 100 MCG/2ML IJ SOLN
INTRAMUSCULAR | Status: DC | PRN
  Administered 2020-07-17: 50 ug via INTRAVENOUS

## 2020-07-17 MED ORDER — SODIUM CHLORIDE 0.9 % IR SOLN
Status: DC | PRN
  Administered 2020-07-17: 1000 mL via INTRAVESICAL

## 2020-07-17 MED ORDER — ACETAMINOPHEN 500 MG PO TABS
1000.0000 mg | ORAL_TABLET | ORAL | Status: AC
  Administered 2020-07-17: 1000 mg via ORAL

## 2020-07-17 MED ORDER — FENTANYL CITRATE (PF) 250 MCG/5ML IJ SOLN
INTRAMUSCULAR | Status: AC
  Filled 2020-07-17: qty 5

## 2020-07-17 MED ORDER — POVIDONE-IODINE 10 % EX SWAB: 2.0000 "application " | Freq: Once | CUTANEOUS | Status: DC

## 2020-07-17 MED ORDER — DEXAMETHASONE SODIUM PHOSPHATE 10 MG/ML IJ SOLN
INTRAMUSCULAR | Status: DC | PRN
  Administered 2020-07-17: 5 mg via INTRAVENOUS

## 2020-07-17 MED ORDER — GABAPENTIN 300 MG PO CAPS
300.0000 mg | ORAL_CAPSULE | ORAL | Status: AC
  Administered 2020-07-17: 300 mg via ORAL

## 2020-07-17 MED ORDER — FENTANYL CITRATE (PF) 100 MCG/2ML IJ SOLN: 25.0000 ug | INTRAMUSCULAR | Status: DC | PRN

## 2020-07-17 MED ORDER — ACETAMINOPHEN 500 MG PO TABS
ORAL_TABLET | ORAL | Status: AC
  Filled 2020-07-17: qty 2

## 2020-07-17 MED ORDER — PROPOFOL 10 MG/ML IV BOLUS
INTRAVENOUS | Status: DC | PRN
  Administered 2020-07-17: 150 mg via INTRAVENOUS
  Administered 2020-07-17: 30 mg via INTRAVENOUS

## 2020-07-17 MED ORDER — EPHEDRINE SULFATE-NACL 50-0.9 MG/10ML-% IV SOSY
PREFILLED_SYRINGE | INTRAVENOUS | Status: DC | PRN
  Administered 2020-07-17: 10 mg via INTRAVENOUS

## 2020-07-17 MED ORDER — LIDOCAINE 2% (20 MG/ML) 5 ML SYRINGE
INTRAMUSCULAR | Status: AC
  Filled 2020-07-17: qty 5

## 2020-07-17 MED ORDER — PROPOFOL 10 MG/ML IV BOLUS
INTRAVENOUS | Status: AC
  Filled 2020-07-17: qty 20

## 2020-07-17 MED ORDER — WHITE PETROLATUM EX OINT
TOPICAL_OINTMENT | CUTANEOUS | Status: AC
  Filled 2020-07-17: qty 5

## 2020-07-17 MED ORDER — PHENAZOPYRIDINE HCL 100 MG PO TABS
ORAL_TABLET | ORAL | Status: AC
  Filled 2020-07-17: qty 1

## 2020-07-17 MED ORDER — LACTATED RINGERS IV SOLN: INTRAVENOUS | Status: DC

## 2020-07-17 MED ORDER — CEFAZOLIN SODIUM-DEXTROSE 2-4 GM/100ML-% IV SOLN
INTRAVENOUS | Status: AC
  Filled 2020-07-17: qty 100

## 2020-07-17 MED ORDER — MIDAZOLAM HCL 5 MG/5ML IJ SOLN
INTRAMUSCULAR | Status: DC | PRN
  Administered 2020-07-17: 1 mg via INTRAVENOUS

## 2020-07-17 MED ORDER — GABAPENTIN 300 MG PO CAPS
ORAL_CAPSULE | ORAL | Status: AC
  Filled 2020-07-17: qty 1

## 2020-07-17 MED ORDER — LIDOCAINE HCL (PF) 2 % IJ SOLN
INTRAMUSCULAR | Status: DC | PRN
  Administered 2020-07-17: 15 mg/kg/h via INTRADERMAL

## 2020-07-17 MED ORDER — DEXAMETHASONE SODIUM PHOSPHATE 10 MG/ML IJ SOLN
INTRAMUSCULAR | Status: AC
  Filled 2020-07-17: qty 1

## 2020-07-17 MED ORDER — CEFAZOLIN SODIUM-DEXTROSE 2-4 GM/100ML-% IV SOLN
2.0000 g | INTRAVENOUS | Status: AC
  Administered 2020-07-17: 2 g via INTRAVENOUS

## 2020-07-17 SURGICAL SUPPLY — 43 items
BLADE SURG 15 STRL LF DISP TIS (BLADE) ×2 IMPLANT
BLADE SURG 15 STRL SS (BLADE) ×1
BNDG GAUZE ELAST 4 BULKY (GAUZE/BANDAGES/DRESSINGS) IMPLANT
CANISTER SUCT 3000ML PPV (MISCELLANEOUS) ×3 IMPLANT
CHLORAPREP W/TINT 26 (MISCELLANEOUS) IMPLANT
COVER WAND RF STERILE (DRAPES) ×3 IMPLANT
DECANTER SPIKE VIAL GLASS SM (MISCELLANEOUS) IMPLANT
DEVICE CAPIO SLIM BOX (INSTRUMENTS) IMPLANT
DEVICE CAPIO SLIM SINGLE (INSTRUMENTS) ×3 IMPLANT
ELECT REM PT RETURN 9FT ADLT (ELECTROSURGICAL) ×3
ELECTRODE REM PT RTRN 9FT ADLT (ELECTROSURGICAL) ×2 IMPLANT
GAUZE PACKING 2X5 YD STRL (GAUZE/BANDAGES/DRESSINGS) IMPLANT
GLOVE SURG ENC MOIS LTX SZ6 (GLOVE) ×3 IMPLANT
GLOVE SURG UNDER POLY LF SZ6.5 (GLOVE) ×3 IMPLANT
GLOVE SURG UNDER POLY LF SZ7 (GLOVE) ×3 IMPLANT
GOWN STRL REUS W/TWL LRG LVL3 (GOWN DISPOSABLE) ×3 IMPLANT
HIBICLENS CHG 4% 4OZ (MISCELLANEOUS) ×3 IMPLANT
KIT TURNOVER CYSTO (KITS) ×3 IMPLANT
MANIFOLD NEPTUNE II (INSTRUMENTS) ×3 IMPLANT
NEEDLE HYPO 22GX1.5 SAFETY (NEEDLE) ×3 IMPLANT
NEEDLE MAYO 6 CRC TAPER PT (NEEDLE) IMPLANT
NS IRRIG 1000ML POUR BTL (IV SOLUTION) ×3 IMPLANT
PACK CYSTO (CUSTOM PROCEDURE TRAY) ×3 IMPLANT
PACK VAGINAL WOMENS (CUSTOM PROCEDURE TRAY) ×3 IMPLANT
PENCIL BUTTON HOLSTER BLD 10FT (ELECTRODE) ×3 IMPLANT
RETRACTOR LONE STAR DISPOSABLE (INSTRUMENTS) IMPLANT
RETRACTOR STAY HOOK 5MM (MISCELLANEOUS) ×3 IMPLANT
SET IRRIG Y TYPE TUR BLADDER L (SET/KITS/TRAYS/PACK) ×3 IMPLANT
SURGIFLO W/THROMBIN 8M KIT (HEMOSTASIS) IMPLANT
SUT ABS MONO DBL WITH NDL 48IN (SUTURE) ×6 IMPLANT
SUT CAPIO PGA 48IN SZ 0 (SUTURE) ×3 IMPLANT
SUT CV-0 GORETEX TFX25 36 (SUTURE) IMPLANT
SUT MON AB 2-0 SH 27 (SUTURE) IMPLANT
SUT PDS PLUS 0 (SUTURE)
SUT PDS PLUS AB 0 CT-2 (SUTURE) IMPLANT
SUT VIC AB 2-0 SH 27 (SUTURE)
SUT VIC AB 2-0 SH 27XBRD (SUTURE) IMPLANT
SUT VIC AB 3-0 SH 18 (SUTURE) IMPLANT
SUT VICRYL 2-0 SH 8X27 (SUTURE) ×3 IMPLANT
SYR BULB EAR ULCER 3OZ GRN STR (SYRINGE) ×3 IMPLANT
SYR CONTROL 10ML LL (SYRINGE) ×3 IMPLANT
TOWEL OR 17X26 10 PK STRL BLUE (TOWEL DISPOSABLE) ×3 IMPLANT
TRAY FOLEY W/BAG SLVR 14FR LF (SET/KITS/TRAYS/PACK) IMPLANT

## 2020-07-17 NOTE — Interval H&P Note (Signed)
History and Physical Interval Note:  07/17/2020 10:03 AM  Erica Peters  has presented today for surgery, with the diagnosis of Posterior Vaginal Prolapse, Vaginal vault prolapse.  The various methods of treatment have been discussed with the patient and family. After consideration of risks, benefits and other options for treatment, the patient has consented to  Procedure(s) with comments: POSTERIOR REPAIR (RECTOCELE) (N/A) WITH POSSIBLE SACROSPINOUS FIXATION (N/A) CYSTOSCOPY (N/A) as a surgical intervention.  The patient's history has been reviewed, patient examined, no change in status, stable for surgery.  I have reviewed the patient's chart and labs.  Questions were answered to the patient's satisfaction.     Jaquita Folds

## 2020-07-17 NOTE — Anesthesia Preprocedure Evaluation (Signed)
Anesthesia Evaluation  Patient identified by MRN, date of birth, ID band Patient awake    Reviewed: Allergy & Precautions, NPO status , Patient's Chart, lab work & pertinent test results  Airway Mallampati: II  TM Distance: >3 FB     Dental   Pulmonary neg pulmonary ROS,    breath sounds clear to auscultation       Cardiovascular hypertension,  Rhythm:Regular Rate:Normal     Neuro/Psych  Headaches,    GI/Hepatic Neg liver ROS, GERD  ,  Endo/Other  diabetes  Renal/GU      Musculoskeletal   Abdominal   Peds  Hematology  (+) anemia ,   Anesthesia Other Findings   Reproductive/Obstetrics                             Anesthesia Physical Anesthesia Plan  ASA: III  Anesthesia Plan: General   Post-op Pain Management:    Induction: Intravenous  PONV Risk Score and Plan: 3 and Ondansetron, Propofol infusion and Midazolam  Airway Management Planned: LMA  Additional Equipment:   Intra-op Plan:   Post-operative Plan:   Informed Consent:   Plan Discussed with:   Anesthesia Plan Comments:         Anesthesia Quick Evaluation

## 2020-07-17 NOTE — Anesthesia Procedure Notes (Signed)
Performed by: Bonney Aid, CRNA

## 2020-07-17 NOTE — Anesthesia Procedure Notes (Signed)
Procedure Name: LMA Insertion Date/Time: 07/17/2020 10:58 AM Performed by: Bonney Aid, CRNA Pre-anesthesia Checklist: Patient identified, Emergency Drugs available, Suction available and Patient being monitored Patient Re-evaluated:Patient Re-evaluated prior to induction Oxygen Delivery Method: Circle system utilized Preoxygenation: Pre-oxygenation with 100% oxygen Induction Type: IV induction Ventilation: Mask ventilation without difficulty LMA: LMA inserted LMA Size: 4.0 Number of attempts: 1 Airway Equipment and Method: Bite block Placement Confirmation: positive ETCO2 Tube secured with: Tape Dental Injury: Teeth and Oropharynx as per pre-operative assessment

## 2020-07-17 NOTE — Telephone Encounter (Signed)
Erica Peters underwent a posterior repair, sacrospinous ligament fixation and cystoscopy on 07/17/20.   She passed her voiding trial.  330ml was backfilled into the bladder Voided 452ml  PVR by bladder scan was 48ml.   She was discharged without a catheter. Please call her for a routine post op check on 07/18/20. Thanks!  Jaquita Folds, MD

## 2020-07-17 NOTE — Discharge Instructions (Signed)
POST OPERATIVE INSTRUCTIONS  General Instructions . Recovery (not bed rest) will last approximately 6 weeks . Walking is encouraged, but refrain from strenuous exercise/ housework/ heavy lifting. o No lifting >10lbs  . Nothing in the vagina- NO intercourse, tampons or douching . Bathing:  Do not submerge in water (NO swimming, bath, hot tub, etc) until after your postop visit. You can shower starting the day after surgery.  . No driving until you are not taking narcotic pain medicine and until your pain is well enough controlled that you can slam on the breaks or make sudden movements if needed.   Taking your medications 1. Please take your acetaminophen and ibuprofen on a schedule for the first 48 hours. Take 600mg ibuprofen, then take 500mg acetaminophen 3 hours later, then continue to alternate ibuprofen and acetaminophen. That way you are taking each type of medication every 6 hours. 2. Take the prescribed narcotic (oxycodone, tramadol, etc) as needed, with a maximum being every 4 hours.  3. Take a stool softener daily to keep your stools soft and preventing you from straining. If you have diarrhea, you decrease your stool softener. This is explained more below. We have prescribed you Miralax.  Reasons to Call the Nurse (see last page for phone numbers) . Heavy Bleeding (changing your pad every 1-2 hours) . Persistent nausea/vomiting . Fever (100.4 degrees or more) . Incision problems (pus or other fluid coming out, redness, warmth, increased pain)  Things to Expect After Surgery . Mild to Moderate pain is normal during the first day or two after surgery. If prescribed, take Ibuprofen or Tylenol first and use the stronger medicine for "break-through" pain. You can overlap these medicines because they work differently.   . Constipation   . To Prevent Constipation:  Eat a well-balanced diet including protein, grains, fresh fruit and vegetables.  Drink plenty of fluids. Walk regularly.   Depending on specific instructions from your physician: take Miralax daily and additionally you can add a stool softener (colace/ docusate) and fiber supplement. Continue as long as you're on pain medications.   . To Treat Constipation:  If you do not have a bowel movement in 2 days after surgery, you can take 2 Tbs of Milk of Magnesia 1-2 times a day until you have a bowel movement. If diarrhea occurs, decrease the amount or stop the laxative. If no results with Milk of Magnesia, you can drink a bottle of magnesium citrate which you can purchase over the counter.  . Fatigue:  This is a normal response to surgery and will improve with time.  Plan frequent rest periods throughout the day.  . Gas Pain:  This is very common but can also be very painful! Drink warm liquids such as herbal teas, bouillon or soup. Walking will help you pass more gas.  Mylicon or Gas-X can be taken over the counter.  . Leaking Urine:  Varying amounts of leakage may occur after surgery.  This should improve with time. Your bladder needs at least 3 months to recover from surgery. If you leak after surgery, be sure to mention this to your doctor at your post-op visit. If you were taking medications for overactive bladder prior to surgery, be sure to restart the medications immediately after surgery.  . Incisions: If you have incisions on your abdomen, the skin glue will dissolve on its own over time. It is ok to gently rinse with soap and water over these incisions but do not scrub.  Catheter Approximately 50%   of patients are unable to urinate after surgery and need to go home with a catheter. This allows your bladder to rest so it can return to full function. If you go home with a catheter, the office will call to set up a voiding trial a few days after surgery. For most patients, by this visit, they are able to urinate on their own. Long term catheter use is rare.   Return to Work  As work demands and recovery times vary  widely, it is hard to predict when you will want to return to work. If you have a desk job with no strenuous physical activity, and if you would like to return sooner than generally recommended, discuss this with your provider or call our office.   Post op concerns  For non-emergent issues, please call the Urogynecology Nurse. Please leave a message and someone will contact you within one business day.  You can also send a message through [place].   AFTER HOURS (After 5:00 PM and on weekends):  For urgent matters that cannot wait until the next business day. Call our office [phone] and connect to the doctor on call.  Please reserve this for important issues.   **FOR ANY TRUE EMERGENCY ISSUES CALL 911 OR GO TO THE NEAREST EMERGENCY ROOM.** Please inform our office or the doctor on call of any emergency.     APPOINTMENTS: Call [phone]   Post Anesthesia Home Care Instructions  Activity: Get plenty of rest for the remainder of the day. A responsible individual must stay with you for 24 hours following the procedure.  For the next 24 hours, DO NOT: -Drive a car -Paediatric nurse -Drink alcoholic beverages -Take any medication unless instructed by your physician -Make any legal decisions or sign important papers.  Meals: Start with liquid foods such as gelatin or soup. Progress to regular foods as tolerated. Avoid greasy, spicy, heavy foods. If nausea and/or vomiting occur, drink only clear liquids until the nausea and/or vomiting subsides. Call your physician if vomiting continues.  Special Instructions/Symptoms: Your throat may feel dry or sore from the anesthesia or the breathing tube placed in your throat during surgery. If this causes discomfort, gargle with warm salt water. The discomfort should disappear within 24 hours.  If you had a scopolamine patch placed behind your ear for the management of post- operative nausea and/or vomiting:  1. The medication in the patch is  effective for 72 hours, after which it should be removed.  Wrap patch in a tissue and discard in the trash. Wash hands thoroughly with soap and water. 2. You may remove the patch earlier than 72 hours if you experience unpleasant side effects which may include dry mouth, dizziness or visual disturbances. 3. Avoid touching the patch. Wash your hands with soap and water after contact with the patch.

## 2020-07-17 NOTE — Transfer of Care (Signed)
Immediate Anesthesia Transfer of Care Note  Patient: Erica Peters  Procedure(s) Performed: POSTERIOR REPAIR (RECTOCELE) (N/A Vagina ) SACROSPINOUS FIXATION (N/A Vagina ) CYSTOSCOPY (N/A Bladder)  Patient Location: PACU  Anesthesia Type:General  Level of Consciousness: awake, alert , oriented and patient cooperative  Airway & Oxygen Therapy: Patient Spontanous Breathing  Post-op Assessment: Report given to RN and Post -op Vital signs reviewed and stable  Post vital signs: Reviewed and stable  Last Vitals:  Vitals Value Taken Time  BP 136/76 07/17/20 1200  Temp    Pulse 84 07/17/20 1201  Resp 19 07/17/20 1202  SpO2 90 % 07/17/20 1201  Vitals shown include unvalidated device data.  Last Pain:  Vitals:   07/17/20 0945  TempSrc: Oral  PainSc: 0-No pain      Patients Stated Pain Goal: 6 (31/67/42 5525)  Complications: No complications documented.

## 2020-07-17 NOTE — Anesthesia Postprocedure Evaluation (Signed)
Anesthesia Post Note  Patient: Advertising copywriter  Procedure(s) Performed: POSTERIOR REPAIR (RECTOCELE) (N/A Vagina ) SACROSPINOUS FIXATION (N/A Vagina ) CYSTOSCOPY (N/A Bladder)     Patient location during evaluation: PACU Anesthesia Type: General Level of consciousness: awake Pain management: pain level controlled Vital Signs Assessment: post-procedure vital signs reviewed and stable Respiratory status: spontaneous breathing Cardiovascular status: stable Postop Assessment: no apparent nausea or vomiting Anesthetic complications: no   No complications documented.  Last Vitals:  Vitals:   07/17/20 1230 07/17/20 1344  BP: (!) 116/57 (!) 144/76  Pulse: 70 77  Resp: 14 18  Temp:  36.8 C  SpO2: 99% 98%    Last Pain:  Vitals:   07/17/20 1344  TempSrc:   PainSc: 0-No pain                 Kimani Hovis

## 2020-07-17 NOTE — Progress Notes (Signed)
Received in Phase 2 with report from [place], Therapist, sports. Placed in recliner and bladder scan performed with 0 urine detected. SIpping ginger ale and eating crackers

## 2020-07-17 NOTE — Op Note (Signed)
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 Stage II pelvic organ prolapse on vaginal exam  2. On cystoscopy, normal bladder an urethra without injury, lesion or foreign body. Brisk bilateral ureteral efflux present.    Specimens: None  Estimated blood loss: 30 mL  IV fluids: 1000 mL  Urine output: 211 mL  Complications: none  Procedure in Detail: After informed consent was obtained, the patient was taken to the operating room where anesthesia was induced and found to be adequate. She was placed in dorsal lithotomy position, taking care to avoid any traction on the extremities, and then prepped and draped in the usual sterile fashion. A self-retaining lonestar retractor was placed using four elastic blue stays.  After a foley catheter was inserted into the urethra.  Vaginal exam concluded that an apical procedure was also needed. Two Allis clamps were along the posterior vaginal wall defect. 1% lidocaine with epinephrine was injected into the vaginal mucosa.  A vertical incision was made between these two Allis clamps with a 15 blade scalpel.  Allis clamps were placed along this incision and Metzenbaum scissors were used to undermine the vaginal mucosa along the incision.  The vaginal mucosa was then sharply dissected off to the septum bilaterally.    For the sacrospinous ligament fixation (SSLF), the ischial spine was accessed on the right side via dissection with scissors and blunt dissection.  The sacrospinous ligament was palpated. Two 0 PDS suture was then placed at the sacrospinous ligament two fingerbreadths medial to the ischial spine, in order to avoid the pudendal neurovascular bundle, using a  Capio needle driver.  The PDS suture was attached to the vaginal epithelium on the ipsilateral side of the vaginal apex and held. Posterior plication of the rectovaginal septum was then performed using plicating sutures of 2-0 Vicryl. The last distal stitch incorporated the perineal body in a U stitch fashion. The vaginal mucosal edges were trimmed and the incision reapproximated with 2-0 Vicryl in a running fashion. The SSLF suture was then tied down with excellent support of the posterior and apical vagina.   The Foley catheter was removed.  A 70-degree cystoscope was introduced, and 360-degree inspection revealed no trauma, lesions or foreign bodies in the bladder. Brisk bilateral ureteral efflux was noted.  The bladder was drained and the cystoscope was removed.  The Foley catheter was reinserted. The vagina was copiously irrigated.  Hemostasis was noted.  Vaginal packing was not placed.  A rectal examination was normal and confirmed no sutures within the rectum. The patient tolerated the procedure well.  She was awakened from anesthesia and transferred to the recovery room in stable condition. All counts were correct x 2.    Jaquita Folds, MD

## 2020-07-18 ENCOUNTER — Encounter (HOSPITAL_BASED_OUTPATIENT_CLINIC_OR_DEPARTMENT_OTHER): Payer: Self-pay | Admitting: Obstetrics and Gynecology

## 2020-07-18 NOTE — Telephone Encounter (Signed)
Post- Op Call  Erica Peters underwent posterior repair, sacrospinous ligament fixation and cystoscopy on 07/18/20 with Dr Wannetta Sender. The patient reports that her pain is controlled. She is taking the pain meds given. She denies vaginal bleeding. She has not had a bowel movement and is taking Miralax for a bowel regimen. She was discharged without a catheter and will not return for a voiding trial.   Spoke to her daughter and patient is doing well  [name], CMA

## 2020-07-19 ENCOUNTER — Encounter: Payer: Self-pay | Admitting: Obstetrics and Gynecology

## 2020-08-23 ENCOUNTER — Encounter: Payer: Self-pay | Admitting: Obstetrics and Gynecology

## 2020-08-23 ENCOUNTER — Other Ambulatory Visit: Payer: Self-pay

## 2020-08-23 ENCOUNTER — Ambulatory Visit (INDEPENDENT_AMBULATORY_CARE_PROVIDER_SITE_OTHER): Payer: Medicare Other | Admitting: Obstetrics and Gynecology

## 2020-08-23 VITALS — BP 155/82 | HR 87 | Wt 157.0 lb

## 2020-08-23 DIAGNOSIS — Z9889 Other specified postprocedural states: Secondary | ICD-10-CM

## 2020-08-23 NOTE — Progress Notes (Signed)
[facility]  Date of Visit: 08/23/2020  History of Present Illness: Erica Peters is a 65 y.o. female scheduled today for a post-operative visit.   Surgery: s/p posterior repair, sacrospinous ligament fixation and cystoscopy on 07/17/20  She passed her postoperative void trial.   Postoperative course has been uncomplicated.   Today she reports she is very happy with the surgery and has been feeling well.   UTI in the last 6 weeks? No  Pain? No  She has returned to her normal activity (except for postop restrictions) Vaginal bulge? No  Stress incontinence: No  Urgency/frequency: No  Urge incontinence: Had a few episodes after surgery in the first week but then it resolved on its own Voiding dysfunction: No  Bowel issues: No   Subjective Success: Do you usually have a bulge or something falling out that you can see or feel in the vaginal area? No  Retreatment Success: Any retreatment with surgery or pessary for any compartment? No    Medications: She has a current medication list which includes the following prescription(s): amlodipine and pantoprazole.   Allergies: Patient has No Known Allergies.   Physical Exam: BP (!) 155/82   Pulse 87   Wt 157 lb (71.2 kg)   BMI 33.97 kg/m   Abdomen: soft, non-tender, without masses or organomegaly Pelvic Examination: Vagina: Incisions healing well. Sutures are present at incision line and at the vaginal cuff and there is not granulation tissue. No tenderness along the anterior or posterior vagina. No apical tenderness. No pelvic masses.   POP-Q: POP-Q  -3                                            Aa   -3                                           Ba  -7                                              C   2                                            Gh  4                                            Pb  7.5                                            tvl   -3                                            Ap  -3  Bp                                                 D    ---------------------------------------------------------  Assessment and Plan:  1. Post-operative state     - The patient was given a copy of her operative note for her records. - Can resume regular activity including exercise. Advised to wait an additional two weeks for intercourse due to presence of sutures. - Discussed avoidance of heavy lifting >25lbs and straining long term to reduce the risk of recurrence.   All questions answered. Follow up as needed if symptoms worsen.   Jaquita Folds, MD

## 2020-08-23 NOTE — Patient Instructions (Addendum)
Gradually increase your activity. Avoid heavy lifting long term >25lbs since this can cause the prolapse to return.   You can resume intercourse after two more weeks.

## 2020-08-30 ENCOUNTER — Encounter: Payer: Medicare Other | Admitting: Obstetrics and Gynecology

## 2020-10-17 ENCOUNTER — Other Ambulatory Visit: Payer: Self-pay | Admitting: Physician Assistant

## 2020-10-17 DIAGNOSIS — N6489 Other specified disorders of breast: Secondary | ICD-10-CM

## 2020-10-27 ENCOUNTER — Other Ambulatory Visit: Payer: Self-pay

## 2020-10-27 ENCOUNTER — Ambulatory Visit
Admission: RE | Admit: 2020-10-27 | Discharge: 2020-10-27 | Disposition: A | Discharge status: Home / Self Care | Payer: Medicare Other | Source: Ambulatory Visit | Attending: Physician Assistant | Admitting: Physician Assistant

## 2020-10-27 DIAGNOSIS — N6489 Other specified disorders of breast: Secondary | ICD-10-CM

## 2020-12-04 ENCOUNTER — Other Ambulatory Visit: Payer: Self-pay | Admitting: Orthopedic Surgery

## 2020-12-08 ENCOUNTER — Encounter
Admission: RE | Admit: 2020-12-08 | Discharge: 2020-12-08 | Disposition: A | Discharge status: Home / Self Care | Payer: Medicare Other | Source: Ambulatory Visit | Attending: Orthopedic Surgery | Admitting: Orthopedic Surgery

## 2020-12-08 ENCOUNTER — Other Ambulatory Visit: Payer: Self-pay

## 2020-12-08 NOTE — Pre-Procedure Instructions (Signed)
Pre-admit phone interview completed, , patient instructions discussed over the phone with the help of interpreter (ID [phone]. )

## 2020-12-08 NOTE — Patient Instructions (Addendum)
Your procedure is scheduled on: 8.25. 2022  Report to the Registration Desk on the 1st floor of the [facility]. To find out your arrival time, please call [phone] between 1PM - 3PM on: 12/13/2020  REMEMBER: Instructions that are not followed completely may result in serious medical risk, up to and including death; or upon the discretion of your surgeon and anesthesiologist your surgery may need to be rescheduled.  Do not eat food after midnight the night before surgery.  No gum chewing, lozengers or hard candies.  You may however, drink water up to 2 hours before you are scheduled to arrive for your surgery. Do not drink anything within 2 hours of your scheduled arrival time.  Clear liquids include: - water    In addition, your doctor has ordered for you to drink the provided   Pre-Surgery Clear Carbohydrate Drink   Drinking this carbohydrate drink up to two hours before surgery helps to reduce insulin resistance and improve patient outcomes. Please complete drinking 2 hours prior to scheduled arrival time.  TAKE THESE MEDICATIONS THE MORNING OF SURGERY WITH A SIP OF WATER: Protonix (take one the night before and one on the morning of surgery - helps to prevent nausea after surgery.) Amlodipine      One week prior to surgery: Stop Anti-inflammatories (NSAIDS) such as Advil, Aleve, Ibuprofen, Motrin, Naproxen, Naprosyn and Aspirin based products such as Excedrin, Goodys Powder, BC Powder.  Stop ANY OVER THE COUNTER supplements until after surgery. You may however, continue to take Tylenol if needed for pain up until the day of surgery.  No Alcohol for 24 hours before or after surgery.  No Smoking including e-cigarettes for 24 hours prior to surgery.  No chewable tobacco products for at least 6 hours prior to surgery.  No nicotine patches on the day of surgery.  Do not use any "recreational" drugs for at least a week prior to your surgery.  Please be advised that the  combination of cocaine and anesthesia may have negative outcomes, up to and including death. If you test positive for cocaine, your surgery will be cancelled.  On the morning of surgery brush your teeth with toothpaste and water, you may rinse your mouth with mouthwash if you wish. Do not swallow any toothpaste or mouthwash.  Do not wear jewelry, make-up, hairpins, clips or nail polish.  Do not wear lotions, powders, or perfumes.   Do not shave body from the neck down 48 hours prior to surgery just in case you cut yourself which could leave a site for infection.  Also, freshly shaved skin may become irritated if using the CHG soap.  Contact lenses, hearing aids and dentures may not be worn into surgery.  Do not bring valuables to the hospital. [facility] is not responsible for any missing/lost belongings or valuables.   Use CHG Soap or wipes as directed on instruction sheet.-provided for you   Notify your doctor if there is any change in your medical condition (cold, fever, infection).  Wear comfortable clothing (specific to your surgery type) to the hospital.  After surgery, you can help prevent lung complications by doing breathing exercises.  Take deep breaths and cough every 1-2 hours. Your doctor may order a device called an Incentive Spirometer to help you take deep breaths.   If you are being admitted to the hospital overnight, leave your suitcase in the car. After surgery it may be brought to your room.  If you are being discharged the  day of surgery, you will not be allowed to drive home. You will need a responsible adult (18 years or older) to drive you home and stay with you that night.   If you are taking public transportation, you will need to have a responsible adult (18 years or older) with you. Please confirm with your physician that it is acceptable to use public transportation.   Please call the [facility] at [phone] if you have any  questions about these instructions.  Surgery Visitation Policy:  Patients undergoing a surgery or procedure may have one family member or support person with them as long as that person is not COVID-19 positive or experiencing its symptoms.  That person may remain in the waiting area during the procedure.  Inpatient Visitation:    Visiting hours are 7 a.m. to 8 p.m. Inpatients will be allowed two visitors daily. The visitors may change each day during the patient's stay. No visitors under the age of 53. Any visitor under the age of 25 must be accompanied by an adult. The visitor must pass COVID-19 screenings, use hand sanitizer when entering and exiting the patient's room and wear a mask at all times, including in the patient's room. Patients must also wear a mask when staff or their visitor are in the room. Masking is required regardless of vaccination status.

## 2020-12-11 ENCOUNTER — Encounter
Admission: RE | Admit: 2020-12-11 | Discharge: 2020-12-11 | Disposition: A | Discharge status: Home / Self Care | Payer: Medicare Other | Source: Ambulatory Visit | Attending: Orthopedic Surgery | Admitting: Orthopedic Surgery

## 2020-12-11 ENCOUNTER — Other Ambulatory Visit: Payer: Self-pay

## 2020-12-11 DIAGNOSIS — Z01810 Encounter for preprocedural cardiovascular examination: Secondary | ICD-10-CM | POA: Diagnosis not present

## 2020-12-14 ENCOUNTER — Ambulatory Visit: Payer: Medicare Other | Admitting: Anesthesiology

## 2020-12-14 ENCOUNTER — Other Ambulatory Visit: Payer: Self-pay

## 2020-12-14 ENCOUNTER — Encounter: Payer: Self-pay | Admitting: Orthopedic Surgery

## 2020-12-14 ENCOUNTER — Ambulatory Visit
Admission: RE | Admit: 2020-12-14 | Discharge: 2020-12-14 | Disposition: A | Discharge status: Home / Self Care | Payer: Medicare Other | Attending: Orthopedic Surgery | Admitting: Orthopedic Surgery

## 2020-12-14 ENCOUNTER — Encounter
Admission: RE | Disposition: A | Discharge status: Home / Self Care | Payer: Self-pay | Source: Home / Self Care | Attending: Orthopedic Surgery

## 2020-12-14 DIAGNOSIS — M67432 Ganglion, left wrist: Secondary | ICD-10-CM | POA: Insufficient documentation

## 2020-12-14 DIAGNOSIS — Z79899 Other long term (current) drug therapy: Secondary | ICD-10-CM | POA: Insufficient documentation

## 2020-12-14 DIAGNOSIS — I10 Essential (primary) hypertension: Secondary | ICD-10-CM | POA: Diagnosis not present

## 2020-12-14 HISTORY — PX: GANGLION CYST EXCISION: SHX1691

## 2020-12-14 LAB — GLUCOSE, CAPILLARY: Glucose-Capillary: 172 mg/dL — ABNORMAL HIGH (ref 70–99)

## 2020-12-14 SURGERY — EXCISION, GANGLION CYST, WRIST
Anesthesia: General | Site: Wrist | Laterality: Left

## 2020-12-14 MED ORDER — FENTANYL CITRATE (PF) 100 MCG/2ML IJ SOLN
INTRAMUSCULAR | Status: DC | PRN
  Administered 2020-12-14: 50 ug via INTRAVENOUS

## 2020-12-14 MED ORDER — MIDAZOLAM HCL 2 MG/2ML IJ SOLN
INTRAMUSCULAR | Status: DC | PRN
  Administered 2020-12-14: 2 mg via INTRAVENOUS

## 2020-12-14 MED ORDER — FENTANYL CITRATE (PF) 100 MCG/2ML IJ SOLN
INTRAMUSCULAR | Status: AC
  Filled 2020-12-14: qty 2

## 2020-12-14 MED ORDER — ONDANSETRON HCL 4 MG/2ML IJ SOLN
INTRAMUSCULAR | Status: DC | PRN
  Administered 2020-12-14: 4 mg via INTRAVENOUS

## 2020-12-14 MED ORDER — MEPERIDINE HCL 25 MG/ML IJ SOLN: 6.2500 mg | INTRAMUSCULAR | Status: DC | PRN

## 2020-12-14 MED ORDER — FENTANYL CITRATE (PF) 100 MCG/2ML IJ SOLN: 25.0000 ug | INTRAMUSCULAR | Status: DC | PRN

## 2020-12-14 MED ORDER — 0.9 % SODIUM CHLORIDE (POUR BTL) OPTIME
TOPICAL | Status: DC | PRN
  Administered 2020-12-14: 500 mL

## 2020-12-14 MED ORDER — ORAL CARE MOUTH RINSE: 15.0000 mL | Freq: Once | OROMUCOSAL | Status: AC

## 2020-12-14 MED ORDER — ONDANSETRON HCL 4 MG/2ML IJ SOLN: 4.0000 mg | Freq: Once | INTRAMUSCULAR | Status: DC | PRN

## 2020-12-14 MED ORDER — PROPOFOL 10 MG/ML IV BOLUS
INTRAVENOUS | Status: AC
  Filled 2020-12-14: qty 20

## 2020-12-14 MED ORDER — DEXAMETHASONE SODIUM PHOSPHATE 10 MG/ML IJ SOLN
INTRAMUSCULAR | Status: DC | PRN
  Administered 2020-12-14: 10 mg via INTRAVENOUS

## 2020-12-14 MED ORDER — CHLORHEXIDINE GLUCONATE 0.12 % MT SOLN: 15.0000 mL | Freq: Once | OROMUCOSAL | Status: AC

## 2020-12-14 MED ORDER — FAMOTIDINE 20 MG PO TABS
ORAL_TABLET | ORAL | Status: AC
  Administered 2020-12-14: 20 mg
  Filled 2020-12-14: qty 1

## 2020-12-14 MED ORDER — LACTATED RINGERS IV SOLN: INTRAVENOUS | Status: DC

## 2020-12-14 MED ORDER — MIDAZOLAM HCL 2 MG/2ML IJ SOLN
INTRAMUSCULAR | Status: AC
  Filled 2020-12-14: qty 2

## 2020-12-14 MED ORDER — CHLORHEXIDINE GLUCONATE 0.12 % MT SOLN
OROMUCOSAL | Status: AC
  Administered 2020-12-14: 15 mL via OROMUCOSAL
  Filled 2020-12-14: qty 15

## 2020-12-14 MED ORDER — BUPIVACAINE HCL (PF) 0.5 % IJ SOLN
INTRAMUSCULAR | Status: AC
  Filled 2020-12-14: qty 30

## 2020-12-14 MED ORDER — HYDROCODONE-ACETAMINOPHEN 5-325 MG PO TABS: 1.0000 | ORAL_TABLET | ORAL | 0 refills | Status: DC | PRN

## 2020-12-14 MED ORDER — FAMOTIDINE 20 MG PO TABS: 20.0000 mg | ORAL_TABLET | Freq: Once | ORAL | Status: AC

## 2020-12-14 MED ORDER — CEFAZOLIN SODIUM-DEXTROSE 2-4 GM/100ML-% IV SOLN
2.0000 g | INTRAVENOUS | Status: AC
  Administered 2020-12-14: 2 g via INTRAVENOUS

## 2020-12-14 MED ORDER — LIDOCAINE HCL (CARDIAC) PF 100 MG/5ML IV SOSY
PREFILLED_SYRINGE | INTRAVENOUS | Status: DC | PRN
  Administered 2020-12-14: 80 mg via INTRAVENOUS

## 2020-12-14 MED ORDER — CEFAZOLIN SODIUM-DEXTROSE 2-4 GM/100ML-% IV SOLN
INTRAVENOUS | Status: AC
  Filled 2020-12-14: qty 100

## 2020-12-14 MED ORDER — BUPIVACAINE HCL 0.5 % IJ SOLN
INTRAMUSCULAR | Status: DC | PRN
  Administered 2020-12-14: 10 mL

## 2020-12-14 MED ORDER — PROPOFOL 10 MG/ML IV BOLUS
INTRAVENOUS | Status: DC | PRN
  Administered 2020-12-14: 130 mg via INTRAVENOUS

## 2020-12-14 SURGICAL SUPPLY — 30 items
BNDG ELASTIC 3X5.8 VLCR NS LF (GAUZE/BANDAGES/DRESSINGS) ×2 IMPLANT
CANISTER SUCT 1200ML W/VALVE (MISCELLANEOUS) ×2 IMPLANT
CAST PADDING 3X4FT ST 30246 (SOFTGOODS) ×1
CHLORAPREP W/TINT 26 (MISCELLANEOUS) ×2 IMPLANT
CUFF TOURN SGL QUICK 18X4 (TOURNIQUET CUFF) ×2 IMPLANT
ELECT CAUTERY NEEDLE 2.0 MIC (NEEDLE) ×2 IMPLANT
GAUZE 4X4 16PLY ~~LOC~~+RFID DBL (SPONGE) ×2 IMPLANT
GAUZE SPONGE 4X4 12PLY STRL (GAUZE/BANDAGES/DRESSINGS) ×2 IMPLANT
GAUZE XEROFORM 1X8 LF (GAUZE/BANDAGES/DRESSINGS) ×2 IMPLANT
GLOVE SURG SYN 9.0  PF PI (GLOVE) ×1
GLOVE SURG SYN 9.0 PF PI (GLOVE) ×1 IMPLANT
GOWN SRG 2XL LVL 4 RGLN SLV (GOWNS) ×1 IMPLANT
GOWN STRL NON-REIN 2XL LVL4 (GOWNS) ×1
GOWN STRL REUS W/ TWL LRG LVL3 (GOWN DISPOSABLE) ×1 IMPLANT
GOWN STRL REUS W/TWL LRG LVL3 (GOWN DISPOSABLE) ×1
KIT TURNOVER KIT A (KITS) ×2 IMPLANT
MANIFOLD NEPTUNE II (INSTRUMENTS) ×2 IMPLANT
NS IRRIG 500ML POUR BTL (IV SOLUTION) ×2 IMPLANT
PACK EXTREMITY ARMC (MISCELLANEOUS) ×2 IMPLANT
PAD CAST CTTN 3X4 STRL (SOFTGOODS) ×1 IMPLANT
SCALPEL PROTECTED #15 DISP (BLADE) ×4 IMPLANT
SPLINT CAST 1 STEP 3X12 (MISCELLANEOUS) ×2 IMPLANT
SUT ETHILON 4-0 (SUTURE) ×1
SUT ETHILON 4-0 FS2 18XMFL BLK (SUTURE) ×1
SUT ETHILON 5-0 FS-2 18 BLK (SUTURE) ×2 IMPLANT
SUT MNCRL 4-0 (SUTURE) ×1
SUT MNCRL 4-018XMFL (SUTURE) ×1
SUTURE ETHLN 4-0 FS2 18XMF BLK (SUTURE) ×1 IMPLANT
SUTURE MNCRL 4-018XMF (SUTURE) ×1 IMPLANT
WATER STERILE IRR 500ML POUR (IV SOLUTION) ×2 IMPLANT

## 2020-12-14 NOTE — Discharge Instructions (Addendum)
Keep dressing clean and dry Work on finger range of motion but do not try to bend the wrist Pain medicine as directed Call office if you are having problems   AMBULATORY SURGERY  DISCHARGE INSTRUCTIONS   The drugs that you were given will stay in your system until tomorrow so for the next 24 hours you should not:  Drive an automobile Make any legal decisions Drink any alcoholic beverage   You may resume regular meals tomorrow.  Today it is better to start with liquids and gradually work up to solid foods.  You may eat anything you prefer, but it is better to start with liquids, then soup and crackers, and gradually work up to solid foods.   Please notify your doctor immediately if you have any unusual bleeding, trouble breathing, redness and pain at the surgery site, drainage, fever, or pain not relieved by medication.    Your post-operative visit with Dr.                                       is: Date:                        Time:    Please call to schedule your post-operative visit.  Additional Instructions:

## 2020-12-14 NOTE — Anesthesia Preprocedure Evaluation (Signed)
Anesthesia Evaluation  Patient identified by MRN, date of birth, ID band Patient awake    Reviewed: Allergy & Precautions, NPO status , Patient's Chart, lab work & pertinent test results  Airway Mallampati: II  TM Distance: >3 FB     Dental no notable dental hx.    Pulmonary neg pulmonary ROS,    breath sounds clear to auscultation       Cardiovascular hypertension, Pt. on medications  Rhythm:Regular Rate:Normal     Neuro/Psych  Headaches,    GI/Hepatic Neg liver ROS, GERD  Medicated,  Endo/Other  diabetes, Well Controlled  Renal/GU      Musculoskeletal   Abdominal   Peds  Hematology  (+) anemia ,   Anesthesia Other Findings Diet-controlled type 2 diabetes mellitus ([place])  followed by pcp  GERD (gastroesophageal reflux disease) Hypertension  followed by pcp IDA (iron deficiency anemia)   Pelvic prolapse    Wears partial dentures       Reproductive/Obstetrics                            Anesthesia Physical  Anesthesia Plan  ASA: 3  Anesthesia Plan: General   Post-op Pain Management:    Induction: Intravenous  PONV Risk Score and Plan: 3 and Ondansetron, Propofol infusion and Midazolam  Airway Management Planned: LMA  Additional Equipment:   Intra-op Plan:   Post-operative Plan: Extubation in OR  Informed Consent: I have reviewed the patients History and Physical, chart, labs and discussed the procedure including the risks, benefits and alternatives for the proposed anesthesia with the patient or authorized representative who has indicated his/her understanding and acceptance.       Plan Discussed with: CRNA, Anesthesiologist and Surgeon  Anesthesia Plan Comments:        Anesthesia Quick Evaluation

## 2020-12-14 NOTE — Transfer of Care (Signed)
Immediate Anesthesia Transfer of Care Note  Patient: Erica Peters  Procedure(s) Performed: Left dorsal ganglion excision (Left: Wrist)  Patient Location: PACU  Anesthesia Type:General  Level of Consciousness: sedated  Airway & Oxygen Therapy: Patient Spontanous Breathing and Patient connected to face mask oxygen  Post-op Assessment: Report given to RN and Post -op Vital signs reviewed and stable  Post vital signs: Reviewed and stable  Last Vitals:  Vitals Value Taken Time  BP 136/67 12/14/20 1500  Temp 36.1 C 12/14/20 1425  Pulse 72 12/14/20 1504  Resp 16 12/14/20 1504  SpO2 99 % 12/14/20 1504  Vitals shown include unvalidated device data.  Last Pain:  Vitals:   12/14/20 1127  TempSrc: Oral  PainSc: 0-No pain         Complications: No notable events documented.

## 2020-12-14 NOTE — H&P (Signed)
Chief Complaint  Patient presents with   Left Hand - Pain    History of the Present Illness: Erica Peters is a 65 y.o. female here today.   The patient presents for evaluation of a left wrist cyst. The patient is right-hand dominant.  The patient is Spanish speaking and there was an interpreter present via video for today's visit. The patient states if she rubs her left wrist, it is painful to touch. She has also noticed that the cyst is growing.  The patient is right-hand dominant.  The patient is not currently working. She lives in [place], [place].  I have reviewed past medical, surgical, social and family history, and allergies as documented in the EMR.  Past Medical History: Past Medical History:  Diagnosis Date   Ankle pain   Hypertension   Past Surgical History: Past Surgical History:  Procedure Laterality Date   COLONOSCOPY N/A 03/09/2010  Dr. Darrold Span @ [facility] - Int. Hemorrhoids, Diverticulosis, rpt 10 yrs per [facility]   foot surgery Right 2017  reconstruction   HYSTERECTOMY   REPAIR VAGINAL PROLAPSE 07/17/2020   S/P hemorrhoidectomy 2014  [facility]   Varicose vein surgery   Past Family History: Family History  Problem Relation Age of Onset   Osteoporosis (Thinning of bones) Mother   High blood pressure (Hypertension) Mother   Rheum arthritis Mother   Myocardial Infarction (Heart attack) Father   Medications: Current Outpatient Medications Ordered in Epic  Medication Sig Dispense Refill   amLODIPine (NORVASC) 5 MG tablet TAKE 1 TABLET(5 MG) BY MOUTH EVERY DAY 90 tablet 1   ascorbic acid (VITAMIN C) 500 MG tablet Take 500 mg by mouth once daily   calcium carbonate-vitamin D3 (OS-CAL 500+D) 500 mg(1,'250mg'$ ) -200 unit tablet Take 1 tablet by mouth 2 (two) times daily with meals.   cetirizine (ZYRTEC) 10 mg capsule Take 10 mg by mouth once daily. Reported on 06/14/2015 (Patient not taking: No sig reported)   cyanocobalamin (VITAMIN B12) 1000 MCG tablet Take  1,000 mcg by mouth once daily.   GLUCOSAMINE HCL/CHONDR SU A NA (OSTEO BI-FLEX ORAL) Take by mouth once daily   ibuprofen (ADVIL,MOTRIN) 200 MG tablet Take 200 mg by mouth every 6 (six) hours as needed for Pain.   pantoprazole (PROTONIX) 40 MG DR tablet Take 1 tablet (40 mg total) by mouth once daily 90 tablet 1   rosuvastatin (CRESTOR) 10 MG tablet Take 1 tablet (10 mg total) by mouth once daily 90 tablet 1   No current Epic-ordered facility-administered medications on file.   Allergies: No Known Allergies   Body mass index is 36.61 kg/m.  Review of Systems: A comprehensive 14 point ROS was performed, reviewed, and the pertinent orthopaedic findings are documented in the HPI.  Vitals:  12/01/20 0905  BP: (!) 144/82    General Physical Examination:   General/Constitutional: No apparent distress: well-nourished and well developed. Eyes: Pupils equal, round with synchronous movement. Lungs:  Clear to auscultation HEENT:  Normal Vascular: No edema, swelling or tenderness, except as noted in detailed exam. Cardiac: Heart rate and rhythm is regular. Integumentary: No impressive skin lesions present, except as noted in detailed exam. Neuro/Psych: Normal mood and affect, oriented to person, place and time.  Musculoskeletal Examination:  On exam, large painful mass to the left wrist.  Radiographs:  AP, lateral, and oblique x-rays of the left hand were ordered and personally reviewed today. These show no acute trauma. No significant degenerative changes. There is a large soft tissue  mass visible on the lateral view on the dorsal wrist consistent with a ganglion cyst.  X-ray Impression Normal wrist except for a ganglion cyst.  Assessment: ICD-10-CM  1. Cyst of joint of left hand M25.842   Plan:  The patient has clinical findings of left wrist ganglion cyst.  We discussed the patient's x-ray findings. I explained the mass should be removed if it is painful. I gave the patient  a handout on ganglion cyst. I explained she will need to wear a brace after surgery to help prevent the cyst from returning.   We will schedule the patient for surgery in the near future.  Surgical Risks:  The nature of the condition and the proposed procedure has been reviewed in detail with the patient. Surgical versus non-surgical options and prognosis for recovery have been reviewed and the inherent risks and benefits of each have been discussed including the risks of infection, bleeding, injury to nerves/blood vessels/tendons, incomplete relief of symptoms, persisting pain and/or stiffness, loss of function, complex regional pain syndrome, failure of the procedure, as appropriate.  Teeth: 2 removable molars  Attestation: I, Dawn Royse, am documenting for [facility], MD utilizing Nuance DAX.    Electronically signed by Lauris Poag, MD at 12/01/2020 7:33 PM EDT  Reviewed  H+P. No changes noted.

## 2020-12-14 NOTE — Anesthesia Postprocedure Evaluation (Signed)
Anesthesia Post Note  Patient: Advertising copywriter  Procedure(s) Performed: Left dorsal ganglion excision (Left: Wrist)  Patient location during evaluation: PACU Anesthesia Type: General Level of consciousness: awake and alert, awake and oriented Pain management: pain level controlled Vital Signs Assessment: post-procedure vital signs reviewed and stable Respiratory status: spontaneous breathing, nonlabored ventilation and respiratory function stable Cardiovascular status: blood pressure returned to baseline and stable Postop Assessment: no apparent nausea or vomiting Anesthetic complications: no   No notable events documented.   Last Vitals:  Vitals:   12/14/20 1430 12/14/20 1445  BP: 120/63 125/67  Pulse: 76 72  Resp: 18 11  Temp:    SpO2: 100% 98%    Last Pain:  Vitals:   12/14/20 1127  TempSrc: Oral  PainSc: 0-No pain                 Phill Mutter

## 2020-12-14 NOTE — Anesthesia Procedure Notes (Signed)
Procedure Name: LMA Insertion Date/Time: 12/14/2020 2:08 PM Performed by: Nelda Marseille, CRNA Pre-anesthesia Checklist: Patient identified, Patient being monitored, Timeout performed, Emergency Drugs available and Suction available Patient Re-evaluated:Patient Re-evaluated prior to induction Oxygen Delivery Method: Circle system utilized Preoxygenation: Pre-oxygenation with 100% oxygen Induction Type: IV induction Ventilation: Mask ventilation without difficulty LMA: LMA inserted LMA Size: 4.0 Tube type: Oral Number of attempts: 1 Placement Confirmation: positive ETCO2 and breath sounds checked- equal and bilateral Tube secured with: Tape Dental Injury: Teeth and Oropharynx as per pre-operative assessment

## 2020-12-14 NOTE — Op Note (Signed)
12/14/2020  2:22 PM  PATIENT:  Erica Peters  65 y.o. female  PRE-OPERATIVE DIAGNOSIS:  Cyst of joint of left hand M25.842  POST-OPERATIVE DIAGNOSIS:  Cyst of joint of left hand  PROCEDURE:  Procedure(s): Left dorsal ganglion excision (Left)  SURGEON: Laurene Footman, MD  ASSISTANTS: none  ANESTHESIA:   general  EBL:  Total I/O In: 100 [IV Piggyback:100] Out: -   BLOOD ADMINISTERED:none  DRAINS: none   LOCAL MEDICATIONS USED:  MARCAINE     SPECIMEN:  No Specimen  DISPOSITION OF SPECIMEN:  N/A  COUNTS:  YES  TOURNIQUET:   Total Tourniquet Time Documented: Upper Arm (Left) - 15 minutes Total: Upper Arm (Left) - 15 minutes   IMPLANTS: None  DICTATION: .Dragon Dictation patient was brought to the operating room and after general esthesia was obtained left arm was prepped and draped in the usual sterile fashion.  After patient identification and timeout procedures were completed tourniquet was raised to 250 mmHg.  Transverse incision was made over the dorsum of the wrist and the wrist crease approximately 2 cm in length with subcutaneous tissue spread and the ganglion cyst identified.  It was tracked down to the wrist joint and it was quite thickened consistent with chronic ganglion.  The ganglion is a cut off its stalk and the area just to the joint cauterized and abraded with use of a Water engineer creating scar tissue complete recurrence.  The wound was then thoroughly irrigated and infiltrated with 10 cc of half percent Marcaine plain for postop analgesia.  Wound was closed with simple erupted 5-0 nylon followed by Xeroform 4 x 4's web roll volar splint and Ace wrap tourniquet let down close of the case  PLAN OF CARE: Discharge to home after PACU  PATIENT DISPOSITION:  PACU - hemodynamically stable.

## 2020-12-15 ENCOUNTER — Encounter: Payer: Self-pay | Admitting: Orthopedic Surgery

## 2021-04-17 ENCOUNTER — Other Ambulatory Visit: Payer: Self-pay | Admitting: Obstetrics and Gynecology

## 2021-04-17 DIAGNOSIS — N952 Postmenopausal atrophic vaginitis: Secondary | ICD-10-CM

## 2021-08-15 ENCOUNTER — Other Ambulatory Visit: Payer: Self-pay

## 2021-08-15 ENCOUNTER — Other Ambulatory Visit: Payer: Self-pay | Admitting: Physician Assistant

## 2021-08-15 DIAGNOSIS — Z1231 Encounter for screening mammogram for malignant neoplasm of breast: Secondary | ICD-10-CM

## 2021-08-21 ENCOUNTER — Other Ambulatory Visit: Payer: Self-pay | Admitting: Physician Assistant

## 2021-08-21 DIAGNOSIS — N6489 Other specified disorders of breast: Secondary | ICD-10-CM

## 2021-08-31 ENCOUNTER — Ambulatory Visit (INDEPENDENT_AMBULATORY_CARE_PROVIDER_SITE_OTHER): Payer: Medicare Other

## 2021-08-31 ENCOUNTER — Other Ambulatory Visit: Payer: Self-pay

## 2021-08-31 ENCOUNTER — Ambulatory Visit
Admission: EM | Admit: 2021-08-31 | Discharge: 2021-08-31 | Disposition: A | Discharge status: Home / Self Care | Payer: Medicare Other

## 2021-08-31 DIAGNOSIS — J209 Acute bronchitis, unspecified: Secondary | ICD-10-CM | POA: Diagnosis not present

## 2021-08-31 DIAGNOSIS — R0602 Shortness of breath: Secondary | ICD-10-CM | POA: Diagnosis not present

## 2021-08-31 DIAGNOSIS — R051 Acute cough: Secondary | ICD-10-CM | POA: Diagnosis not present

## 2021-08-31 MED ORDER — AMOXICILLIN-POT CLAVULANATE 875-125 MG PO TABS: 1.0000 | ORAL_TABLET | Freq: Two times a day (BID) | ORAL | 0 refills | Status: AC

## 2021-08-31 MED ORDER — ALBUTEROL SULFATE HFA 108 (90 BASE) MCG/ACT IN AERS: 1.0000 | INHALATION_SPRAY | Freq: Four times a day (QID) | RESPIRATORY_TRACT | 0 refills | Status: DC | PRN

## 2021-08-31 MED ORDER — PROMETHAZINE-DM 6.25-15 MG/5ML PO SYRP: 5.0000 mL | ORAL_SOLUTION | Freq: Three times a day (TID) | ORAL | 0 refills | Status: DC | PRN

## 2021-08-31 NOTE — ED Triage Notes (Signed)
Pt c/o cough with white green phlegm, sore throat, trouble breathing x3weeks ? ?Pt states that she feels like her throat is closing up and she can not breath.  ? ?Pt was around her grand kids who had a viral throat infection. They were not tested for strep. ?

## 2021-08-31 NOTE — ED Provider Notes (Signed)
?[place] ? ? ? ?CSN: [id number] ?Arrival date & time: 08/31/21  1745 ? ? ?  ? ?History   ?Chief Complaint ?Chief Complaint  ?Patient presents with  ? Sore Throat  ? ? ?HPI ?Erica Peters is a 66 y.o. female presenting for 3-week history of nasal congestion, fatigue, cough productive of greenish sputum, sore throat, painful swallowing and shortness of breath.  No associated fever.  Patient states that he feels like her throat is closing at times that she cannot breathe well.  She says that she had asthma when she was younger.  Patient has been around her grandchildren who are sick with a virus.  No other sick contacts.  She has tried multiple over-the-counter cough medicines but is getting worse and not better.  Her son is here today and is helping to translate.  Declines translator and her son helps. ? ?HPI ? ?Past Medical History:  ?Diagnosis Date  ? Diet-controlled type 2 diabetes mellitus ([place])   ? followed by pcp  ? GERD (gastroesophageal reflux disease)   ? Hypertension   ? followed by pcp  ? IDA (iron deficiency anemia)   ? Pelvic prolapse   ? Wears partial dentures   ? uppper  ? ? ?Patient Active Problem List  ? Diagnosis Date Noted  ? Headache 12/05/2019  ? Hypertension 12/02/2019  ? Low back pain 05/20/2017  ? Burning sensation of feet 03/04/2017  ? Left arm numbness 03/04/2017  ? Nerve damage 01/01/2017  ? Gastroesophageal reflux disease without esophagitis 06/26/2016  ? Anemia, iron deficiency 10/04/2015  ? ? ?Past Surgical History:  ?Procedure Laterality Date  ? ANTERIOR AND POSTERIOR REPAIR WITH SACROSPINOUS FIXATION N/A 07/17/2020  ? Procedure: SACROSPINOUS FIXATION;  Surgeon: Jaquita Folds, MD;  Location: [facility];  Service: Gynecology;  Laterality: N/A;  ? BREAST BIOPSY Left 2002  ? neg  ? BREAST CYST ASPIRATION Left 2000  ? neg  ? CYSTOSCOPY N/A 07/17/2020  ? Procedure: CYSTOSCOPY;  Surgeon: Jaquita Folds, MD;  Location: [facility];  Service: Gynecology;  Laterality: N/A;  ? FLAT FOOT RECONSTRUCTION-TAL GASTROC RECESSION Right 01/26/2016  ? Procedure: FLAT FOOT RECONSTRUCTION-TAL GASTROC RECESSION;  Surgeon: Samara Deist, DPM;  Location: [facility];  Service: Podiatry;  Laterality: Right;  ? FOOT ARTHRODESIS Right 01/26/2016  ? Procedure: ARTHRODESIS FOOT;  Surgeon: Samara Deist, DPM;  Location: [facility];  Service: Podiatry;  Laterality: Right;  ? GANGLION CYST EXCISION Left 12/14/2020  ? Procedure: Left dorsal ganglion excision;  Surgeon: Hessie Knows, MD;  Location: [facility];  Service: Orthopedics;  Laterality: Left;  ? HEMORRHOID SURGERY  12-18-2012  @ [place]  ? RECTOCELE REPAIR N/A 07/17/2020  ? Procedure: POSTERIOR REPAIR (RECTOCELE);  Surgeon: Jaquita Folds, MD;  Location: [facility];  Service: Gynecology;  Laterality: N/A;  ? TRIPLE SUBTALAR FUSION Right 01/26/2016  ? Procedure: TRIPLE SUBTALAR FUSION;  Surgeon: Samara Deist, DPM;  Location: [facility];  Service: Podiatry;  Laterality: Right;  ? VAGINAL HYSTERECTOMY  1990  ? VARICOSE VEIN SURGERY    ? ? ?OB History   ?No obstetric history on file. ?  ? ? ? ?Home Medications   ? ?Prior to Admission medications   ?Medication Sig Start Date End Date Taking? Authorizing Provider  ?albuterol (VENTOLIN HFA) 108 (90 Base) MCG/ACT inhaler Inhale 1-2 puffs into the lungs every 6 (six) hours as needed for wheezing or shortness of breath. 08/31/21  Yes Laurene Footman B, PA-C  ?amLODipine (  NORVASC) 5 MG tablet Take 5 mg by mouth daily.   Yes [provider]  ?amoxicillin-clavulanate (AUGMENTIN) 875-125 MG tablet Take 1 tablet by mouth every 12 (twelve) hours for 7 days. 08/31/21 09/07/21 Yes Laurene Footman B, PA-C  ?estradiol (ESTRACE) 0.1 MG/GM vaginal cream PLACE 0.5 GIVE VAGIALLY 2 TIMES A WEEK, PLACE 0.5G NIGHTLY FOR 2 WEEKS THEN TWICE A WEEK AFTER 04/17/21  Yes Donnamae Jude, MD  ?fluticasone (FLONASE) 50 MCG/ACT nasal spray Place 2 sprays into both nostrils daily.  08/15/21  Yes [provider]  ?HYDROcodone-acetaminophen (NORCO) 5-325 MG tablet Take 1 tablet by mouth every 4 (four) hours as needed for moderate pain. 12/14/20  Yes Hessie Knows, MD  ?ibuprofen (ADVIL) 200 MG tablet Take 400 mg by mouth every 6 (six) hours as needed for moderate pain.   Yes [provider]  ?levocetirizine (XYZAL) 5 MG tablet Take 1 tablet by mouth every evening. 08/29/21 08/29/22 Yes [provider]  ?pantoprazole (PROTONIX) 40 MG tablet Take 40 mg by mouth in the morning.   Yes [provider]  ?promethazine-dextromethorphan (PROMETHAZINE-DM) 6.25-15 MG/5ML syrup Take 5 mLs by mouth 3 (three) times daily as needed for cough. 08/31/21  Yes Laurene Footman B, PA-C  ?rosuvastatin (CRESTOR) 10 MG tablet Take 10 mg by mouth at bedtime. 10/16/20  Yes [provider]  ? ? ?Family History ?Family History  ?Problem Relation Age of Onset  ? Breast cancer Other   ? ? ?Social History ?Social History  ? ?Tobacco Use  ? Smoking status: Never  ? Smokeless tobacco: Never  ?Vaping Use  ? Vaping Use: Never used  ?Substance Use Topics  ? Alcohol use: No  ? Drug use: Never  ? ? ? ?Allergies   ?Patient has no known allergies. ? ? ?Review of Systems ?Review of Systems  ?Constitutional:  Positive for fatigue. Negative for chills, diaphoresis and fever.  ?HENT:  Positive for congestion, rhinorrhea and sore throat. Negative for ear pain, sinus pressure and sinus pain.   ?Respiratory:  Positive for cough and shortness of breath.   ?Cardiovascular:  Negative for chest pain.  ?Gastrointestinal:  Negative for abdominal pain, nausea and vomiting.  ?Musculoskeletal:  Negative for arthralgias and myalgias.  ?Skin:  Negative for rash.  ?Neurological:  Negative for weakness and headaches.  ?Hematological:  Negative for adenopathy.  ? ? ?Physical Exam ?Triage Vital Signs ?ED Triage Vitals  ?Enc Vitals Group  ?   BP 08/31/21 1809 (!) 168/84  ?   Pulse Rate 08/31/21 1809 69  ?   Resp 08/31/21  1809 18  ?   Temp 08/31/21 1809 99 ?F (37.2 ?C)  ?   Temp Source 08/31/21 1809 Oral  ?   SpO2 08/31/21 1809 94 %  ?   Weight 08/31/21 1806 152 lb (68.9 kg)  ?   Height 08/31/21 1806 '4\' 9"'$  (1.448 m)  ?   Head Circumference --   ?   Peak Flow --   ?   Pain Score 08/31/21 1805 6  ?   Pain Loc --   ?   Pain Edu? --   ?   Excl. in [place]? --   ? ?No data found. ? ?Updated Vital Signs ?BP (!) 168/84 (BP Location: Left Arm)   Pulse 69   Temp 99 ?F (37.2 ?C) (Oral)   Resp 18   Ht '4\' 9"'$  (1.448 m)   Wt 152 lb (68.9 kg)   SpO2 94%   BMI 32.89 kg/m?  ? ? ?  Physical Exam ?Vitals and nursing note reviewed.  ?Constitutional:   ?   General: She is not in acute distress. ?   Appearance: Normal appearance. She is well-developed. She is ill-appearing. She is not toxic-appearing.  ?HENT:  ?   Head: Normocephalic and atraumatic.  ?   Nose: Congestion present.  ?   Mouth/Throat:  ?   Mouth: Mucous membranes are moist.  ?   Pharynx: Oropharynx is clear. Posterior oropharyngeal erythema present.  ?Eyes:  ?   General: No scleral icterus.    ?   Right eye: No discharge.     ?   Left eye: No discharge.  ?   Conjunctiva/sclera: Conjunctivae normal.  ?Cardiovascular:  ?   Rate and Rhythm: Normal rate and regular rhythm.  ?   Heart sounds: Normal heart sounds.  ?Pulmonary:  ?   Effort: Pulmonary effort is normal. No respiratory distress.  ?   Breath sounds: Normal breath sounds. No wheezing, rhonchi or rales.  ?Musculoskeletal:  ?   Cervical back: Neck supple.  ?Skin: ?   General: Skin is dry.  ?Neurological:  ?   General: No focal deficit present.  ?   Mental Status: She is alert. Mental status is at baseline.  ?   Motor: No weakness.  ?   Gait: Gait normal.  ?Psychiatric:     ?   Mood and Affect: Mood normal.     ?   Behavior: Behavior normal.     ?   Thought Content: Thought content normal.  ? ? ? ?UC Treatments / Results  ?Labs ?(all labs ordered are listed, but only abnormal results are displayed) ?Labs Reviewed - No data to  display ? ?EKG ? ? ?Radiology ?DG Chest 2 View ? ?Result Date: 08/31/2021 ?CLINICAL DATA:  Cough congestion EXAM: CHEST - 2 VIEW COMPARISON:  None Available. FINDINGS: The heart size and mediastinal contours are within nor

## 2021-08-31 NOTE — Discharge Instructions (Signed)
-  The x-ray does not show any evidence of pneumonia.  You have bronchitis.  This is a chest cold which can take a few weeks to resolve.  We will try antibiotics at this time as well as a cough medicine and an inhaler. ?- Increase rest and fluids. ?- Return if you develop fever, worsening chest pain or increased breathing difficulty or go to ER, otherwise follow-up with PCP if not improved after you finish the medicine. ?

## 2021-09-06 ENCOUNTER — Encounter: Payer: Self-pay | Admitting: *Deleted

## 2021-09-07 ENCOUNTER — Encounter: Payer: Self-pay | Admitting: Gastroenterology

## 2021-09-07 ENCOUNTER — Encounter
Admission: RE | Disposition: A | Discharge status: Home / Self Care | Payer: Self-pay | Source: Home / Self Care | Attending: Gastroenterology

## 2021-09-07 ENCOUNTER — Ambulatory Visit: Payer: Medicare Other | Admitting: Anesthesiology

## 2021-09-07 ENCOUNTER — Ambulatory Visit
Admission: RE | Admit: 2021-09-07 | Discharge: 2021-09-07 | Disposition: A | Discharge status: Home / Self Care | Payer: Medicare Other | Attending: Gastroenterology | Admitting: Gastroenterology

## 2021-09-07 DIAGNOSIS — Z1211 Encounter for screening for malignant neoplasm of colon: Secondary | ICD-10-CM | POA: Diagnosis present

## 2021-09-07 DIAGNOSIS — I10 Essential (primary) hypertension: Secondary | ICD-10-CM | POA: Diagnosis not present

## 2021-09-07 DIAGNOSIS — E785 Hyperlipidemia, unspecified: Secondary | ICD-10-CM | POA: Diagnosis not present

## 2021-09-07 DIAGNOSIS — D123 Benign neoplasm of transverse colon: Secondary | ICD-10-CM | POA: Diagnosis not present

## 2021-09-07 DIAGNOSIS — K219 Gastro-esophageal reflux disease without esophagitis: Secondary | ICD-10-CM | POA: Diagnosis not present

## 2021-09-07 DIAGNOSIS — E119 Type 2 diabetes mellitus without complications: Secondary | ICD-10-CM | POA: Diagnosis not present

## 2021-09-07 DIAGNOSIS — K5730 Diverticulosis of large intestine without perforation or abscess without bleeding: Secondary | ICD-10-CM | POA: Diagnosis not present

## 2021-09-07 HISTORY — PX: COLONOSCOPY WITH PROPOFOL: SHX5780

## 2021-09-07 LAB — GLUCOSE, CAPILLARY: Glucose-Capillary: 95 mg/dL (ref 70–99)

## 2021-09-07 SURGERY — COLONOSCOPY WITH PROPOFOL
Anesthesia: General

## 2021-09-07 MED ORDER — PROPOFOL 500 MG/50ML IV EMUL
INTRAVENOUS | Status: DC | PRN
  Administered 2021-09-07: 200 ug/kg/min via INTRAVENOUS

## 2021-09-07 MED ORDER — PHENYLEPHRINE HCL (PRESSORS) 10 MG/ML IV SOLN
INTRAVENOUS | Status: DC | PRN
Start: 2021-09-07 — End: 2021-09-07
  Administered 2021-09-07: 800 ug via INTRAVENOUS
  Administered 2021-09-07: 80 ug via INTRAVENOUS

## 2021-09-07 MED ORDER — SODIUM CHLORIDE 0.9 % IV SOLN
INTRAVENOUS | Status: DC
  Administered 2021-09-07: 20 mL/h via INTRAVENOUS

## 2021-09-07 MED ORDER — PROPOFOL 10 MG/ML IV BOLUS
INTRAVENOUS | Status: DC | PRN
  Administered 2021-09-07: 60 mg via INTRAVENOUS

## 2021-09-07 NOTE — Transfer of Care (Signed)
Immediate Anesthesia Transfer of Care Note  Patient: Erica Peters  Procedure(s) Performed: COLONOSCOPY WITH PROPOFOL  Patient Location: PACU  Anesthesia Type:General  Level of Consciousness: awake, alert  and oriented  Airway & Oxygen Therapy: Patient Spontanous Breathing and Patient connected to nasal cannula oxygen  Post-op Assessment: Report given to RN and Post -op Vital signs reviewed and stable  Post vital signs: Reviewed and stable  Last Vitals:  Vitals Value Taken Time  BP 109/60 09/07/21 1049  Temp    Pulse 72 09/07/21 1050  Resp 19 09/07/21 1050  SpO2 100 % 09/07/21 1050  Vitals shown include unvalidated device data.  Last Pain:  Vitals:   09/07/21 0916  TempSrc: Temporal  PainSc: 0-No pain         Complications: No notable events documented.

## 2021-09-07 NOTE — Anesthesia Preprocedure Evaluation (Signed)
Anesthesia Evaluation  Patient identified by MRN, date of birth, ID band Patient awake    Reviewed: Allergy & Precautions, NPO status , Patient's Chart, lab work & pertinent test results  History of Anesthesia Complications Negative for: history of anesthetic complications  Airway Mallampati: III  TM Distance: <3 FB Neck ROM: full    Dental  (+) Chipped, Poor Dentition, Missing   Pulmonary neg pulmonary ROS, neg shortness of breath,    Pulmonary exam normal        Cardiovascular Exercise Tolerance: Good hypertension, (-) anginaNormal cardiovascular exam     Neuro/Psych  Headaches, negative psych ROS   GI/Hepatic Neg liver ROS, GERD  Controlled,  Endo/Other  diabetes, Type 2  Renal/GU negative Renal ROS  negative genitourinary   Musculoskeletal   Abdominal   Peds  Hematology negative hematology ROS (+)   Anesthesia Other Findings Past Medical History: No date: Diet-controlled type 2 diabetes mellitus ([place])     Comment:  followed by pcp No date: GERD (gastroesophageal reflux disease) No date: Hypertension     Comment:  followed by pcp No date: IDA (iron deficiency anemia) No date: Pelvic prolapse No date: Wears partial dentures     Comment:  uppper  Past Surgical History: 07/17/2020: ANTERIOR AND POSTERIOR REPAIR WITH SACROSPINOUS FIXATION;  N/A     Comment:  Procedure: SACROSPINOUS FIXATION;  Surgeon: Jaquita Folds, MD;  Location: [place];                Service: Gynecology;  Laterality: N/A; 2002: BREAST BIOPSY; Left     Comment:  neg 2000: BREAST CYST ASPIRATION; Left     Comment:  neg 07/17/2020: CYSTOSCOPY; N/A     Comment:  Procedure: CYSTOSCOPY;  Surgeon: Jaquita Folds,               MD;  [facility];  Service:               Gynecology;  Laterality: N/A; 01/26/2016: FLAT FOOT RECONSTRUCTION-TAL GASTROC RECESSION; Right      Comment:  Procedure: FLAT FOOT RECONSTRUCTION-TAL GASTROC               RECESSION;  Surgeon: Samara Deist, DPM;  Location: [facility]               [facility];  Service: Podiatry;  Laterality: Right; 01/26/2016: FOOT ARTHRODESIS; Right     Comment:  Procedure: ARTHRODESIS FOOT;  Surgeon: Samara Deist,               DPM;  Location: [facility];  Service: Podiatry;                Laterality: Right; 12/14/2020: GANGLION CYST EXCISION; Left     Comment:  Procedure: Left dorsal ganglion excision;  Surgeon:               Hessie Knows, MD;  Location: [facility];  Service:               Orthopedics;  Laterality: Left; 12-18-2012  @ [place]: HEMORRHOID SURGERY 07/17/2020: RECTOCELE REPAIR; N/A     Comment:  Procedure: POSTERIOR REPAIR (RECTOCELE);  Surgeon:               Jaquita Folds, MD;  Location: [facility];  Service: Gynecology;  Laterality: N/A; 01/26/2016: TRIPLE SUBTALAR FUSION; Right     Comment:  Procedure: TRIPLE SUBTALAR FUSION;  Surgeon: Samara Deist, DPM;  Location: [facility];  Service: Podiatry;                Laterality: Right; 1990: VAGINAL HYSTERECTOMY No date: VARICOSE VEIN SURGERY  BMI    Body Mass Index: 30.70 kg/m      Reproductive/Obstetrics negative OB ROS                             Anesthesia Physical Anesthesia Plan  ASA: 3  Anesthesia Plan: General   Post-op Pain Management:    Induction: Intravenous  PONV Risk Score and Plan: Propofol infusion and TIVA  Airway Management Planned: Natural Airway and Nasal Cannula  Additional Equipment:   Intra-op Plan:   Post-operative Plan:   Informed Consent: I have reviewed the patients History and Physical, chart, labs and discussed the procedure including the risks, benefits and alternatives for the proposed anesthesia with the patient or authorized representative who has indicated his/her understanding and acceptance.     Dental Advisory Given and  Interpreter used for interveiw  Plan Discussed with: Anesthesiologist, CRNA and Surgeon  Anesthesia Plan Comments: (Patient consented for risks of anesthesia including but not limited to:  - adverse reactions to medications - risk of airway placement if required - damage to eyes, teeth, lips or other oral mucosa - nerve damage due to positioning  - sore throat or hoarseness - Damage to heart, brain, nerves, lungs, other parts of body or loss of life  Patient voiced understanding.)        Anesthesia Quick Evaluation

## 2021-09-07 NOTE — Anesthesia Procedure Notes (Signed)
Date/Time: 09/07/2021 10:31 AM Performed by: Nelda Marseille, CRNA Pre-anesthesia Checklist: Patient identified, Emergency Drugs available, Suction available, Patient being monitored and Timeout performed Oxygen Delivery Method: Nasal cannula

## 2021-09-07 NOTE — H&P (Signed)
Outpatient short stay form Pre-procedure 09/07/2021  Lesly Rubenstein, MD  Primary Physician: Marinda Elk, MD  Reason for visit:  Screening  History of present illness:    66 y/o lady with history of hypertension and HLD with last colonoscopy in 2011 that was normal. No blood thinners. No abdominal surgeries. No family history of GI malignancies.    Current Facility-Administered Medications:    0.9 %  sodium chloride infusion, , Intravenous, Continuous, Jahrel Borthwick, Hilton Cork, MD, Last Rate: 20 mL/hr at 09/07/21 0925, 20 mL/hr at 09/07/21 0925  Medications Prior to Admission  Medication Sig Dispense Refill Last Dose   acetaminophen (TYLENOL) 500 MG tablet Take 500 mg by mouth every 6 (six) hours as needed.   Past Week   albuterol (VENTOLIN HFA) 108 (90 Base) MCG/ACT inhaler Inhale 1-2 puffs into the lungs every 6 (six) hours as needed for wheezing or shortness of breath. 1 g 0 Past Week   amLODipine (NORVASC) 5 MG tablet Take 5 mg by mouth daily.   09/06/2021   amoxicillin-clavulanate (AUGMENTIN) 875-125 MG tablet Take 1 tablet by mouth every 12 (twelve) hours for 7 days. 14 tablet 0 Past Week   calcium-vitamin D (OSCAL WITH D) 500-5 MG-MCG tablet Take 1 tablet by mouth.   Past Week   cetirizine (ZYRTEC) 10 MG tablet Take 10 mg by mouth daily.   Past Week   cyanocobalamin 1000 MCG tablet Take 1,000 mcg by mouth daily.   Past Week   estradiol (ESTRACE) 0.1 MG/GM vaginal cream PLACE 0.5 GIVE VAGIALLY 2 TIMES A WEEK, PLACE 0.5G NIGHTLY FOR 2 WEEKS THEN TWICE A WEEK AFTER 42.5 g 0 Past Week   fluticasone (FLONASE) 50 MCG/ACT nasal spray Place 2 sprays into both nostrils daily.   Past Week   HYDROcodone-acetaminophen (NORCO) 5-325 MG tablet Take 1 tablet by mouth every 4 (four) hours as needed for moderate pain. 20 tablet 0 Past Week   ibuprofen (ADVIL) 200 MG tablet Take 400 mg by mouth every 6 (six) hours as needed for moderate pain.   Past Week   levocetirizine (XYZAL) 5 MG tablet  Take 1 tablet by mouth every evening.   Past Week   pantoprazole (PROTONIX) 40 MG tablet Take 40 mg by mouth in the morning.   Past Week   promethazine-dextromethorphan (PROMETHAZINE-DM) 6.25-15 MG/5ML syrup Take 5 mLs by mouth 3 (three) times daily as needed for cough. 118 mL 0 Past Week   rosuvastatin (CRESTOR) 10 MG tablet Take 10 mg by mouth at bedtime.   Past Week   vitamin C (ASCORBIC ACID) 500 MG tablet Take 500 mg by mouth daily.   Past Week     No Known Allergies   Past Medical History:  Diagnosis Date   Diet-controlled type 2 diabetes mellitus ([place])    followed by pcp   GERD (gastroesophageal reflux disease)    Hypertension    followed by pcp   IDA (iron deficiency anemia)    Pelvic prolapse    Wears partial dentures    uppper    Review of systems:  Otherwise negative.    Physical Exam  Gen: Alert, oriented. Appears stated age.  HEENT: PERRLA. Lungs: No respiratory distress CV: RRR Abd: soft, benign, no masses Ext: No edema    Planned procedures: Proceed with colonoscopy. The patient understands the nature of the planned procedure, indications, risks, alternatives and potential complications including but not limited to bleeding, infection, perforation, damage to internal organs and possible oversedation/side effects from  anesthesia. The patient agrees and gives consent to proceed.  Please refer to procedure notes for findings, recommendations and patient disposition/instructions.     Lesly Rubenstein, MD [facility]

## 2021-09-07 NOTE — Anesthesia Postprocedure Evaluation (Signed)
Anesthesia Post Note  Patient: Erica Peters  Procedure(s) Performed: COLONOSCOPY WITH PROPOFOL  Patient location during evaluation: Endoscopy Anesthesia Type: General Level of consciousness: awake and alert Pain management: pain level controlled Vital Signs Assessment: post-procedure vital signs reviewed and stable Respiratory status: spontaneous breathing, nonlabored ventilation, respiratory function stable and patient connected to nasal cannula oxygen Cardiovascular status: blood pressure returned to baseline and stable Postop Assessment: no apparent nausea or vomiting Anesthetic complications: no   No notable events documented.   Last Vitals:  Vitals:   09/07/21 1050 09/07/21 1100  BP: 109/60 135/65  Pulse: 72 75  Resp: 19 19  Temp:    SpO2: 100% 100%    Last Pain:  Vitals:   09/07/21 1040  TempSrc: Temporal  PainSc:                  Precious Haws Ellise Kovack

## 2021-09-07 NOTE — OR Nursing (Signed)
[place] Leola Brazil) used in [place].

## 2021-09-07 NOTE — Op Note (Signed)
[facility]astroenterology Patient Name: Erica Peters Procedure Date: 09/07/2021 10:17 AM MRN: [id number] Account #: [id number] Date of Birth: [birth year] Admit Type: Outpatient Age: 66 Room: [facility] ENDO ROOM 3 Gender: Female Note Status: Finalized Instrument Name: Jasper Riling [id number] Procedure:             Colonoscopy Indications:           Screening for colorectal malignant neoplasm Providers:             Andrey Farmer MD, MD Referring MD:          Precious Bard, MD (Referring MD) Medicines:             Monitored Anesthesia Care Complications:         No immediate complications. Estimated blood loss:                         Minimal. Procedure:             Pre-Anesthesia Assessment:                        - Prior to the procedure, a History and Physical was                         performed, and patient medications and allergies were                         reviewed. The patient is competent. The risks and                         benefits of the procedure and the sedation options and                         risks were discussed with the patient. All questions                         were answered and informed consent was obtained.                         Patient identification and proposed procedure were                         verified by the physician, the nurse, the                         anesthesiologist, the anesthetist and the technician                         in the endoscopy suite. Mental Status Examination:                         alert and oriented. Airway Examination: normal                         oropharyngeal airway and neck mobility. Respiratory                         Examination: clear to auscultation. CV Examination:  normal. Prophylactic Antibiotics: The patient does not                         require prophylactic antibiotics. Prior                         Anticoagulants: The patient has taken no  previous                         anticoagulant or antiplatelet agents. ASA Grade                         Assessment: II - A patient with mild systemic disease.                         After reviewing the risks and benefits, the patient                         was deemed in satisfactory condition to undergo the                         procedure. The anesthesia plan was to use monitored                         anesthesia care (MAC). Immediately prior to                         administration of medications, the patient was                         re-assessed for adequacy to receive sedatives. The                         heart rate, respiratory rate, oxygen saturations,                         blood pressure, adequacy of pulmonary ventilation, and                         response to care were monitored throughout the                         procedure. The physical status of the patient was                         re-assessed after the procedure.                        After obtaining informed consent, the colonoscope was                         passed under direct vision. Throughout the procedure,                         the patient's blood pressure, pulse, and oxygen                         saturations were monitored continuously. The  Colonoscope was introduced through the anus and                         advanced to the the cecum, identified by appendiceal                         orifice and ileocecal valve. The colonoscopy was                         performed without difficulty. The patient tolerated                         the procedure well. The quality of the bowel                         preparation was adequate to identify polyps. Findings:      The perianal and digital rectal examinations were normal.      Two sessile polyps were found in the hepatic flexure. The polyps were 1       to 2 mm in size. These polyps were removed with a jumbo cold forceps.        Resection and retrieval were complete. Estimated blood loss was minimal.      A single small-mouthed diverticulum was found in the ascending colon.      The exam was otherwise without abnormality on direct and retroflexion       views. Impression:            - Two 1 to 2 mm polyps at the hepatic flexure, removed                         with a jumbo cold forceps. Resected and retrieved.                        - Diverticulosis in the ascending colon.                        - The examination was otherwise normal on direct and                         retroflexion views. Recommendation:        - Discharge patient to home.                        - Resume previous diet.                        - Continue present medications.                        - Await pathology results.                        - Repeat colonoscopy for surveillance based on                         pathology results.                        - Return to referring physician as previously  scheduled. Procedure Code(s):     --- Professional ---                        [phone], Colonoscopy, flexible; with biopsy, single or                         multiple Diagnosis Code(s):     --- Professional ---                        Z12.11, Encounter for screening for malignant neoplasm                         of colon                        K63.5, Polyp of colon                        K57.30, Diverticulosis of large intestine without                         perforation or abscess without bleeding CPT copyright 2019 [employer] Association. All rights reserved. The codes documented in this report are preliminary and upon coder review may  be revised to meet current compliance requirements. Andrey Farmer MD, MD 09/07/2021 10:48:55 AM Number of Addenda: 0 Note Initiated On: 09/07/2021 10:17 AM Scope Withdrawal Time: 0 hours 10 minutes 29 seconds  Total Procedure Duration: 0 hours 20 minutes 59 seconds  Estimated  Blood Loss:  Estimated blood loss was minimal.      [facility]

## 2021-09-07 NOTE — Interval H&P Note (Signed)
History and Physical Interval Note:  09/07/2021 10:15 AM  Erica Peters  has presented today for surgery, with the diagnosis of colon cancer screening.  The various methods of treatment have been discussed with the patient and family. After consideration of risks, benefits and other options for treatment, the patient has consented to  Procedure(s) with comments: COLONOSCOPY WITH PROPOFOL (N/A) - SPANISH INTERPRETER as a surgical intervention.  The patient's history has been reviewed, patient examined, no change in status, stable for surgery.  I have reviewed the patient's chart and labs.  Questions were answered to the patient's satisfaction.     Lesly Rubenstein  Ok to proceed with colonoscopy

## 2021-09-10 LAB — SURGICAL PATHOLOGY

## 2021-09-15 ENCOUNTER — Other Ambulatory Visit: Payer: Self-pay

## 2021-09-15 ENCOUNTER — Ambulatory Visit (INDEPENDENT_AMBULATORY_CARE_PROVIDER_SITE_OTHER): Payer: Medicare Other

## 2021-09-15 ENCOUNTER — Ambulatory Visit
Admission: EM | Admit: 2021-09-15 | Discharge: 2021-09-15 | Disposition: A | Discharge status: Home / Self Care | Payer: Medicare Other | Attending: Emergency Medicine | Admitting: Emergency Medicine

## 2021-09-15 DIAGNOSIS — R051 Acute cough: Secondary | ICD-10-CM | POA: Diagnosis not present

## 2021-09-15 MED ORDER — AZITHROMYCIN 250 MG PO TABS: ORAL_TABLET | ORAL | 0 refills | Status: DC

## 2021-09-15 NOTE — Discharge Instructions (Signed)
Take azithromycin as directed.

## 2021-09-15 NOTE — ED Triage Notes (Signed)
Pt declined an interpreter  Pt was seen 2 weeks ago and was diagnosed with Bronchitis.  Pt states that her symptoms have continued and gotten worse.  Pt c/o cough, congestion, pain in chest when coughing.

## 2021-09-15 NOTE — ED Provider Notes (Signed)
Patient Contact: 11:59 AM (approximate)   History   Cough and Nasal Congestion   HPI  Erica Peters is a 66 y.o. female presents to the emergency department with rhinorrhea, cough and nasal congestion.  She was seen and evaluated 2 weeks ago and was also diagnosed with bronchitis.  Patient reports that she took her Augmentin as directed and had a week where she was asymptomatic within states her symptoms returned.  She denies current chest pain, chest tightness or shortness of breath.      Physical Exam   Triage Vital Signs: ED Triage Vitals  Enc Vitals Group     BP 09/15/21 1130 139/80     Pulse Rate 09/15/21 1130 94     Resp 09/15/21 1130 18     Temp 09/15/21 1130 98.7 F (37.1 C)     Temp Source 09/15/21 1130 Oral     SpO2 09/15/21 1130 99 %     Weight 09/15/21 1128 151 lb 14.4 oz (68.9 kg)     Height 09/15/21 1128 '4\' 11"'$  (1.499 m)     Head Circumference --      Peak Flow --      Pain Score 09/15/21 1128 7     Pain Loc --      Pain Edu? --      Excl. in [place]? --     Most recent vital signs: Vitals:   09/15/21 1130  BP: 139/80  Pulse: 94  Resp: 18  Temp: 98.7 F (37.1 C)  SpO2: 99%     General: Alert and in no acute distress. Eyes:  PERRL. EOMI. Head: No acute traumatic findings ENT:      Ears: Tms are pearly.       Nose: No congestion/rhinnorhea.      Mouth/Throat: Mucous membranes are moist. Neck: No stridor. No cervical spine tenderness to palpation. Cardiovascular:  Good peripheral perfusion Respiratory: Normal respiratory effort without tachypnea or retractions. Lungs CTAB. Good air entry to the bases with no decreased or absent breath sounds. Gastrointestinal: Bowel sounds 4 quadrants. Soft and nontender to palpation. No guarding or rigidity. No palpable masses. No distention. No CVA tenderness. Musculoskeletal: Full range of motion to all extremities.  Neurologic:  No gross focal neurologic deficits are appreciated.  Skin:   No rash  noted Other:   ED Results / Procedures / Treatments   Labs (all labs ordered are listed, but only abnormal results are displayed) Labs Reviewed - No data to display     RADIOLOGY  I personally viewed and evaluated these images as part of my medical decision making, as well as reviewing the written report by the radiologist.  ED Provider Interpretation: I personally interpreted chest x-ray and agree with radiologist.  No evidence of pneumonia.   PROCEDURES:  Critical Care performed: No  Procedures   MEDICATIONS ORDERED IN ED: Medications - No data to display   IMPRESSION / MDM / [place] / ED COURSE  I reviewed the triage vital signs and the nursing notes.                              Assessment and plan:  Cough 66 year old female presents to the urgent care with cough, nasal congestion, rhinorrhea for the past 5 to 6 days.  Patient states that she had similar symptoms 2 weeks ago.  She states that she was asymptomatic for 1 week before symptoms returned.  Will cover patient with azithromycin to cover her for atypical pneumonia.  Recommended rest and hydration at home.  Return precautions were given to return with new or worsening symptoms.     FINAL CLINICAL IMPRESSION(S) / ED DIAGNOSES   Final diagnoses:  Acute cough     Rx / DC Orders   ED Discharge Orders          Ordered    azithromycin (ZITHROMAX Z-PAK) 250 MG tablet        09/15/21 1237             Note:  This document was prepared using Dragon voice recognition software and may include unintentional dictation errors.   Erica Peters, [place] 09/15/21 1243

## 2021-11-05 ENCOUNTER — Inpatient Hospital Stay: Admission: RE | Admit: 2021-11-05 | Payer: Medicare Other | Source: Ambulatory Visit

## 2021-11-23 ENCOUNTER — Ambulatory Visit
Admission: RE | Admit: 2021-11-23 | Discharge: 2021-11-23 | Disposition: A | Discharge status: Home / Self Care | Payer: Medicare Other | Source: Ambulatory Visit | Attending: Physician Assistant | Admitting: Physician Assistant

## 2021-11-23 DIAGNOSIS — N6489 Other specified disorders of breast: Secondary | ICD-10-CM | POA: Diagnosis present

## 2022-02-11 ENCOUNTER — Encounter: Payer: Self-pay | Admitting: *Deleted

## 2022-02-18 ENCOUNTER — Encounter (INDEPENDENT_AMBULATORY_CARE_PROVIDER_SITE_OTHER): Payer: Self-pay

## 2022-06-26 NOTE — Progress Notes (Unsigned)
[facility] Return Visit  SUBJECTIVE  History of Present Illness: Erica Peters is a 67 y.o. female seen in follow-up for repeated infections and feeling a bulge. Patient underwent s/p posterior repair, sacrospinous ligament fixation and cystoscopy on 07/17/20 with Dr. Wannetta Sender.  Been using cream for 6 months Patient reports she is feeling a bulge in the vagina   Endorses urinary infection and feels her vaginal area is inflamed, for which she takes ibuprofen.   She gets amoxicillin ([place]  from her country and takes a dose when she feels she is getting UTI symptoms.   Past Medical History: Patient  has a past medical history of Diet-controlled type 2 diabetes mellitus ([place]), GERD (gastroesophageal reflux disease), Hypertension, IDA (iron deficiency anemia), Pelvic prolapse, and Wears partial dentures.   Past Surgical History: She  has a past surgical history that includes Hemorrhoid surgery (12-18-2012  @ [facility]); Varicose vein surgery; Flat foot reconstruction-tal gastroc recession (Right, 01/26/2016); Foot arthrodesis (Right, 01/26/2016); Triple subtalar fusion (Right, 01/26/2016); Breast biopsy (Left, 2002); Breast cyst aspiration (Left, 2000); Vaginal hysterectomy (1990); Rectocele repair (N/A, 07/17/2020); Anterior and posterior repair with sacrospinous fixation (N/A, 07/17/2020); Cystoscopy (N/A, 07/17/2020); Ganglion cyst excision (Left, 12/14/2020); and Colonoscopy with propofol (N/A, 09/07/2021).   Medications: She has a current medication list which includes the following prescription(s): acetaminophen, albuterol, amlodipine, azithromycin, calcium-vitamin d, cetirizine, cyanocobalamin, estradiol, fluticasone, gabapentin, hydrocodone bit-homatropine, ibuprofen, levocetirizine, pantoprazole, rosuvastatin, and ascorbic acid.   Allergies: Patient has No Known Allergies.   Social History: Patient  reports that she has never smoked. She has never used smokeless  tobacco. She reports that she does not drink alcohol and does not use drugs.      OBJECTIVE    Last A1c 6.5 on 06/14/22  POC urine today: Positive for protein, negative for all other components.  Physical Exam: Vitals:   06/27/22 0919 06/27/22 1011  BP: (!) 142/68 124/67  Pulse: 83 73   Gen: No apparent distress, A&O x 3.  Detailed Urogynecologic Evaluation:  No evidence of recurrent prolapse, but patient does have a skin tag near the introitus that may be what she is feeling. She also has a connecting piece of skin that is going from the urethra on the right side to the vaginal wall.    POP-Q  -2.5                                            Aa   -2.5                                           Ba  -6                                              C   3                                            Gh  3  Pb  7                                            tvl   -2.5                                            Ap  -2.5                                            Bp                                                 D      ASSESSMENT AND PLAN    Erica Peters is a 67 y.o. with:  1. Urinary frequency   2. Vaginal atrophy    POC Urine negative today for UTI. She is taking amoxicillin from [place] when she gets UTI symptoms. While this is not suggested and not encouraged she reports this is how she manges her symptoms.  Patient has significant vaginal atrophy. She is using estrogen cream twice a week but was not using it around the urethra and clitoris. We discussed massaging it deep into the vaginal walls and using it around the urethra and clitoris to assist in preventing UTIs. Patient given sample packets of lubrications and discussed with patient having lubrication during intercourse and on days she is not using the vaginal estrogen. We discussed using coconut oil and vitamin e oil as over the counter options for lubrication.    Patient denies any further questions or concerns and is relieved there is no new prolapse. She can follow up PRN.   Due to language barrier, an interpreter was present during the history-taking and subsequent discussion (and for part of the physical exam) with this patient.

## 2022-06-27 ENCOUNTER — Ambulatory Visit (INDEPENDENT_AMBULATORY_CARE_PROVIDER_SITE_OTHER): Payer: Medicare Other | Admitting: Obstetrics and Gynecology

## 2022-06-27 ENCOUNTER — Encounter: Payer: Self-pay | Admitting: Obstetrics and Gynecology

## 2022-06-27 VITALS — BP 124/67 | HR 73

## 2022-06-27 DIAGNOSIS — R350 Frequency of micturition: Secondary | ICD-10-CM | POA: Diagnosis not present

## 2022-06-27 DIAGNOSIS — N952 Postmenopausal atrophic vaginitis: Secondary | ICD-10-CM | POA: Diagnosis not present

## 2022-06-27 LAB — POCT URINALYSIS DIPSTICK
Bilirubin, UA: NEGATIVE
Blood, UA: NEGATIVE
Glucose, UA: NEGATIVE
Leukocytes, UA: NEGATIVE
Nitrite, UA: NEGATIVE
Protein, UA: POSITIVE — AB
Spec Grav, UA: 1.025 (ref 1.010–1.025)
Urobilinogen, UA: 0.2 E.U./dL
pH, UA: 6 (ref 5.0–8.0)

## 2022-06-27 NOTE — Patient Instructions (Signed)
Can use coconut oil and vitamin e oil for lubrication.   Continue using the estrogen twice a week. Please also rub the estrogen cream around the clitoris and urethra. This will help prevent infections.

## 2022-09-27 ENCOUNTER — Ambulatory Visit
Admission: EM | Admit: 2022-09-27 | Discharge: 2022-09-27 | Disposition: A | Discharge status: Home / Self Care | Payer: Medicare Other | Attending: Physician Assistant | Admitting: Physician Assistant

## 2022-09-27 ENCOUNTER — Encounter: Payer: Self-pay | Admitting: Emergency Medicine

## 2022-09-27 DIAGNOSIS — J45901 Unspecified asthma with (acute) exacerbation: Secondary | ICD-10-CM

## 2022-09-27 DIAGNOSIS — J029 Acute pharyngitis, unspecified: Secondary | ICD-10-CM

## 2022-09-27 DIAGNOSIS — R059 Cough, unspecified: Secondary | ICD-10-CM | POA: Diagnosis present

## 2022-09-27 DIAGNOSIS — B9789 Other viral agents as the cause of diseases classified elsewhere: Secondary | ICD-10-CM | POA: Diagnosis not present

## 2022-09-27 DIAGNOSIS — J069 Acute upper respiratory infection, unspecified: Secondary | ICD-10-CM

## 2022-09-27 DIAGNOSIS — Z1152 Encounter for screening for COVID-19: Secondary | ICD-10-CM | POA: Diagnosis not present

## 2022-09-27 LAB — GROUP A STREP BY PCR: Group A Strep by PCR: NOT DETECTED

## 2022-09-27 LAB — SARS CORONAVIRUS 2 BY RT PCR: SARS Coronavirus 2 by RT PCR: NEGATIVE

## 2022-09-27 MED ORDER — LIDOCAINE VISCOUS HCL 2 % MT SOLN: 15.0000 mL | OROMUCOSAL | 0 refills | Status: AC | PRN

## 2022-09-27 MED ORDER — ALBUTEROL SULFATE HFA 108 (90 BASE) MCG/ACT IN AERS: 1.0000 | INHALATION_SPRAY | Freq: Four times a day (QID) | RESPIRATORY_TRACT | 0 refills | Status: AC | PRN

## 2022-09-27 MED ORDER — PROMETHAZINE-DM 6.25-15 MG/5ML PO SYRP: 5.0000 mL | ORAL_SOLUTION | Freq: Four times a day (QID) | ORAL | 0 refills | Status: AC | PRN

## 2022-09-27 MED ORDER — PREDNISONE 20 MG PO TABS: 40.0000 mg | ORAL_TABLET | Freq: Every day | ORAL | 0 refills | Status: AC

## 2022-09-27 NOTE — ED Provider Notes (Signed)
[facility]    CSN: [id number] Arrival date & time: 09/27/22  5732      History   Chief Complaint Chief Complaint  Patient presents with   Cough    HPI Erica Peters is a 67 y.o. female presenting for 1-week history of fatigue, cough productive of greenish sputum, sore throat, painful swallowing, voice hoarseness and shortness of breath.  No associated fever.  Patient states that he feels like her throat is very inflamed and that she cannot breathe well.  She says that she had asthma when she was younger.  Patient has been around her grandchildren who are sick with a virus.  No other sick contacts.  She has tried multiple over-the-counter cough medicines but is getting worse and not better.  She reports using an old albuterol inhaler which has helped.  Language interpreter service used. HPI  Past Medical History:  Diagnosis Date   Diet-controlled type 2 diabetes mellitus (HCC)    followed by pcp   GERD (gastroesophageal reflux disease)    Hypertension    followed by pcp   IDA (iron deficiency anemia)    Pelvic prolapse    Wears partial dentures    uppper    Patient Active Problem List   Diagnosis Date Noted   Headache 12/05/2019   Hypertension 12/02/2019   Low back pain 05/20/2017   Burning sensation of feet 03/04/2017   Left arm numbness 03/04/2017   Nerve damage 01/01/2017   Gastroesophageal reflux disease without esophagitis 06/26/2016   Anemia, iron deficiency 10/04/2015    Past Surgical History:  Procedure Laterality Date   ANTERIOR AND POSTERIOR REPAIR WITH SACROSPINOUS FIXATION N/A 07/17/2020   Procedure: SACROSPINOUS FIXATION;  Surgeon: Marguerita Beards, MD;  Location: [facility];  Service: Gynecology;  Laterality: N/A;   BREAST BIOPSY Left 2002   neg   BREAST CYST ASPIRATION Left 2000   neg   COLONOSCOPY WITH PROPOFOL N/A 09/07/2021   Procedure: COLONOSCOPY WITH PROPOFOL;  Surgeon: Regis Bill, MD;   Location: [facility];  Service: Endoscopy;  Laterality: N/A;  SPANISH INTERPRETER   CYSTOSCOPY N/A 07/17/2020   Procedure: CYSTOSCOPY;  Surgeon: Marguerita Beards, MD;  Location: [facility];  Service: Gynecology;  Laterality: N/A;   FLAT FOOT RECONSTRUCTION-TAL GASTROC RECESSION Right 01/26/2016   Procedure: FLAT FOOT RECONSTRUCTION-TAL GASTROC RECESSION;  Surgeon: Gwyneth Revels, DPM;  Location: [facility];  Service: Podiatry;  Laterality: Right;   FOOT ARTHRODESIS Right 01/26/2016   Procedure: ARTHRODESIS FOOT;  Surgeon: Gwyneth Revels, DPM;  Location: [facility];  Service: Podiatry;  Laterality: Right;   GANGLION CYST EXCISION Left 12/14/2020   Procedure: Left dorsal ganglion excision;  Surgeon: Kennedy Bucker, MD;  Location: [facility];  Service: Orthopedics;  Laterality: Left;   HEMORRHOID SURGERY  12-18-2012  @ [facility]   RECTOCELE REPAIR N/A 07/17/2020   Procedure: POSTERIOR REPAIR (RECTOCELE);  Surgeon: Marguerita Beards, MD;  Location: [facility];  Service: Gynecology;  Laterality: N/A;   TRIPLE SUBTALAR FUSION Right 01/26/2016   Procedure: TRIPLE SUBTALAR FUSION;  Surgeon: Gwyneth Revels, DPM;  Location: [facility];  Service: Podiatry;  Laterality: Right;   VAGINAL HYSTERECTOMY  1990   VARICOSE VEIN SURGERY      OB History   No obstetric history on file.      Home Medications    Prior to Admission medications   Medication Sig Start Date End Date Taking? Authorizing Provider  lidocaine (XYLOCAINE) 2 % solution Use  as directed 15 mLs in the mouth or throat every 3 (three) hours as needed for mouth pain (swish and spit). 09/27/22  Yes Shirlee Latch, PA-C  predniSONE (DELTASONE) 20 MG tablet Take 2 tablets (40 mg total) by mouth daily for 5 days. 09/27/22 10/02/22 Yes Shirlee Latch, PA-C  promethazine-dextromethorphan (PROMETHAZINE-DM) 6.25-15 MG/5ML syrup Take 5 mLs by mouth 4 (four) times daily as needed. 09/27/22  Yes Shirlee Latch, PA-C  acetaminophen  (TYLENOL) 500 MG tablet Take 500 mg by mouth every 6 (six) hours as needed.    [provider]  albuterol (VENTOLIN HFA) 108 (90 Base) MCG/ACT inhaler Inhale 1-2 puffs into the lungs every 6 (six) hours as needed for wheezing or shortness of breath. 09/27/22   Eusebio Friendly B, PA-C  amLODipine (NORVASC) 5 MG tablet Take 5 mg by mouth daily.    [provider]  calcium-vitamin D (OSCAL WITH D) 500-5 MG-MCG tablet Take 1 tablet by mouth.    [provider]  cetirizine (ZYRTEC) 10 MG tablet Take 10 mg by mouth daily.    [provider]  cyanocobalamin 1000 MCG tablet Take 1,000 mcg by mouth daily.    [provider]  estradiol (ESTRACE) 0.1 MG/GM vaginal cream PLACE 0.5 GIVE VAGIALLY 2 TIMES A WEEK, PLACE 0.5G NIGHTLY FOR 2 WEEKS THEN TWICE A WEEK AFTER 04/17/21   Reva Bores, MD  fluticasone [facility]) 50 MCG/ACT nasal spray Place 2 sprays into both nostrils daily. 08/15/21   [provider]  gabapentin (NEURONTIN) 100 MG capsule Take 1 capsule by mouth at bedtime. 06/21/22 06/21/23  [provider]  ibuprofen (ADVIL) 200 MG tablet Take 400 mg by mouth every 6 (six) hours as needed for moderate pain.    [provider]  levocetirizine (XYZAL) 5 MG tablet Take 1 tablet by mouth every evening. 08/29/21 08/29/22  [provider]  pantoprazole (PROTONIX) 40 MG tablet Take 40 mg by mouth in the morning.    [provider]  rosuvastatin (CRESTOR) 10 MG tablet Take 10 mg by mouth at bedtime. 10/16/20   [provider]  vitamin C (ASCORBIC ACID) 500 MG tablet Take 500 mg by mouth daily.    [provider]    Family History Family History  Problem Relation Age of Onset   Breast cancer Other     Social History Social History   Tobacco Use   Smoking status: Never   Smokeless tobacco: Never  Vaping Use   Vaping Use: Never used  Substance Use Topics   Alcohol use: No   Drug use: Never      Allergies   Patient has no known allergies.   Review of Systems Review of Systems  Constitutional:  Positive for fatigue. Negative for chills, diaphoresis and fever.  HENT:  Positive for congestion, sore throat and voice change. Negative for ear pain, rhinorrhea, sinus pressure and sinus pain.   Respiratory:  Positive for cough and shortness of breath.   Cardiovascular:  Negative for chest pain.  Gastrointestinal:  Negative for abdominal pain, nausea and vomiting.  Musculoskeletal:  Negative for arthralgias and myalgias.  Skin:  Negative for rash.  Neurological:  Negative for weakness and headaches.  Hematological:  Negative for adenopathy.     Physical Exam Triage Vital Signs ED Triage Vitals  Enc Vitals Group     BP 08/31/21 1809 (!) 168/84     Pulse Rate 08/31/21 1809 69     Resp 08/31/21 1809 18  Temp 08/31/21 1809 99 F (37.2 C)     Temp Source 08/31/21 1809 Oral     SpO2 08/31/21 1809 94 %     Weight 08/31/21 1806 152 lb (68.9 kg)     Height 08/31/21 1806 4\' 9"  (1.448 m)     Head Circumference --      Peak Flow --      Pain Score 08/31/21 1805 6     Pain Loc --      Pain Edu? --      Excl. in GC? --    No data found.  Updated Vital Signs BP (!) 148/79 (BP Location: Left Arm)   Pulse 78   Temp 98.4 F (36.9 C) (Oral)   Resp 14   Ht 4\' 11"  (1.499 m)   Wt 151 lb 14.4 oz (68.9 kg)   SpO2 96%   BMI 30.68 kg/m    Physical Exam Vitals and nursing note reviewed.  Constitutional:      General: She is not in acute distress.    Appearance: Normal appearance. She is well-developed. She is ill-appearing. She is not toxic-appearing.     Comments: +voice hoarseness  HENT:     Head: Normocephalic and atraumatic.     Nose: No congestion.     Mouth/Throat:     Mouth: Mucous membranes are moist.     Pharynx: Oropharynx is clear. Posterior oropharyngeal erythema present.  Eyes:     General: No scleral icterus.       Right eye: No discharge.        Left  eye: No discharge.     Conjunctiva/sclera: Conjunctivae normal.  Cardiovascular:     Rate and Rhythm: Normal rate and regular rhythm.     Heart sounds: Normal heart sounds.  Pulmonary:     Effort: Pulmonary effort is normal. No respiratory distress.     Breath sounds: Normal breath sounds. No wheezing, rhonchi or rales.  Musculoskeletal:     Cervical back: Neck supple.  Skin:    General: Skin is dry.  Neurological:     General: No focal deficit present.     Mental Status: She is alert. Mental status is at baseline.     Motor: No weakness.     Gait: Gait normal.  Psychiatric:        Mood and Affect: Mood normal.        Behavior: Behavior normal.        Thought Content: Thought content normal.      UC Treatments / Results  Labs (all labs ordered are listed, but only abnormal results are displayed) Labs Reviewed  GROUP A STREP BY PCR  SARS CORONAVIRUS 2 BY RT PCR    EKG   Radiology No results found.  Procedures Procedures (including critical care time)  Medications Ordered in UC Medications - No data to display  Initial Impression / Assessment and Plan / UC Course  I have reviewed the triage vital signs and the nursing notes.  Pertinent labs & imaging results that were available during my care of the patient were reviewed by me and considered in my medical decision making (see chart for details).  67 year old female presenting for sore throat, nasal congestion and productive cough for the past 1 week.  Also reports fatigue, shortness of breath and difficulty swallowing at times.  She is afebrile.  She is ill-appearing but nontoxic.  On exam she does not have nasal congestion, but does have erythema posterior pharynx with  mild swelling.  Chest is clear to auscultation but lung sounds are mildly diminished throughout.  PCR strep and COVID testing obtained.  Negative testing.   Viral illness with asthma exacerbation (mild). Sent Promethazine DM to take 3 times a day as  needed for cough.  Also sent albuterol inhaler and prednisone. Reviewed following up with PCP, return and ER precautions.   Final Clinical Impressions(s) / UC Diagnoses   Final diagnoses:  Viral URI with cough  Exacerbation of asthma, unspecified asthma severity, unspecified whether persistent  Sore throat     Discharge Instructions      -Tu prueba de estreptococo fue negativa. -Tu prueba de COVID fue negativa. -Tus sntomas son consistentes con un virus. He enviado a la farmacia medicamento para la tos, prednisona y Advertising account executive.  -Aumenta tu descanso y lquidos. Debera sentirse mejor durante la prxima semana, pero si no se siente mejor o comienza a Scientist, research (physical sciences), dolor de garganta que [place], dolor sinusal, dolor en el pecho o aumento de la dificultad para Industrial/product designer, regrese para una nueva evaluacin para ver si es necesario. antibiticos.  -Your strep test was negative. -Your COVID test was negative. -Your symptoms are consistent with a virus. I have sent cough medicine, prednisone and an inhaler to the pharmacy.  -Increase your rest and fluids. You should feel better over the next 1 week but if you are not feeling better or you start to have a fever, worsening sore throat, sinus pain, chest pain or increased shortness of breath please return for re-evaluation to see if you might need antibiotics.        ED Prescriptions     Medication Sig Dispense Auth. Provider   albuterol (VENTOLIN HFA) 108 (90 Base) MCG/ACT inhaler Inhale 1-2 puffs into the lungs every 6 (six) hours as needed for wheezing or shortness of breath. 1 g Eusebio Friendly B, PA-C   predniSONE (DELTASONE) 20 MG tablet Take 2 tablets (40 mg total) by mouth daily for 5 days. 10 tablet Shirlee Latch, PA-C   promethazine-dextromethorphan (PROMETHAZINE-DM) 6.25-15 MG/5ML syrup Take 5 mLs by mouth 4 (four) times daily as needed. 118 mL Eusebio Friendly B, PA-C   lidocaine (XYLOCAINE) 2 % solution Use as directed 15 mLs in  the mouth or throat every 3 (three) hours as needed for mouth pain (swish and spit). 100 mL Shirlee Latch, PA-C      PDMP not reviewed this encounter.     Shirlee Latch, PA-C 09/27/22 1049

## 2022-09-27 NOTE — Discharge Instructions (Signed)
-  Tu prueba de estreptococo fue negativa. -Tu prueba de COVID fue negativa. -Tus sntomas son consistentes con un virus. He enviado a la farmacia medicamento para la tos, prednisona y Advertising account executive.  -Aumenta tu descanso y lquidos. Debera sentirse mejor durante la prxima semana, pero si no se siente mejor o comienza a Scientist, research (physical sciences), dolor de garganta que [place], dolor sinusal, dolor en el pecho o aumento de la dificultad para Industrial/product designer, regrese para una nueva evaluacin para ver si es necesario. antibiticos.  -Your strep test was negative. -Your COVID test was negative. -Your symptoms are consistent with a virus. I have sent cough medicine, prednisone and an inhaler to the pharmacy.  -Increase your rest and fluids. You should feel better over the next 1 week but if you are not feeling better or you start to have a fever, worsening sore throat, sinus pain, chest pain or increased shortness of breath please return for re-evaluation to see if you might need antibiotics.

## 2022-09-27 NOTE — ED Triage Notes (Signed)
Patient c/o cough and chest congestion for 4 days.  Patient unsure of fevers.  

## 2022-11-30 ENCOUNTER — Ambulatory Visit
Admission: EM | Admit: 2022-11-30 | Discharge: 2022-11-30 | Disposition: A | Discharge status: Home / Self Care | Payer: Medicare Other | Attending: Emergency Medicine | Admitting: Emergency Medicine

## 2022-11-30 ENCOUNTER — Ambulatory Visit (INDEPENDENT_AMBULATORY_CARE_PROVIDER_SITE_OTHER): Payer: Medicare Other

## 2022-11-30 DIAGNOSIS — R059 Cough, unspecified: Secondary | ICD-10-CM | POA: Diagnosis not present

## 2022-11-30 DIAGNOSIS — K449 Diaphragmatic hernia without obstruction or gangrene: Secondary | ICD-10-CM | POA: Diagnosis present

## 2022-11-30 DIAGNOSIS — Z1152 Encounter for screening for COVID-19: Secondary | ICD-10-CM | POA: Diagnosis not present

## 2022-11-30 DIAGNOSIS — J069 Acute upper respiratory infection, unspecified: Secondary | ICD-10-CM | POA: Diagnosis present

## 2022-11-30 LAB — SARS CORONAVIRUS 2 BY RT PCR: SARS Coronavirus 2 by RT PCR: NEGATIVE

## 2022-11-30 NOTE — ED Provider Notes (Signed)
[facility]    CSN: [id number] Arrival date & time: 11/30/22  0845      History   Chief Complaint Chief Complaint  Patient presents with   Cough   Nasal Congestion    HPI Erica Peters is a 67 y.o. female.   67 year old female,Erica Peters, presents to urgent care for evaluation of nasal congestion and cough for 4 days.  Patient states she coughed up some blood-tinged sputum yesterday and today is a green ,patient states she has taken over-the-counter meds without relief, unknown illness contact.  The history is provided by the patient. A language interpreter was used.    Past Medical History:  Diagnosis Date   Diet-controlled type 2 diabetes mellitus (HCC)    followed by pcp   GERD (gastroesophageal reflux disease)    Hypertension    followed by pcp   IDA (iron deficiency anemia)    Pelvic prolapse    Wears partial dentures    uppper    Patient Active Problem List   Diagnosis Date Noted   Viral upper respiratory tract infection 11/30/2022   Hiatal hernia 11/30/2022   Headache 12/05/2019   Hypertension 12/02/2019   Low back pain 05/20/2017   Burning sensation of feet 03/04/2017   Left arm numbness 03/04/2017   Nerve damage 01/01/2017   Gastroesophageal reflux disease without esophagitis 06/26/2016   Anemia, iron deficiency 10/04/2015    Past Surgical History:  Procedure Laterality Date   ANTERIOR AND POSTERIOR REPAIR WITH SACROSPINOUS FIXATION N/A 07/17/2020   Procedure: SACROSPINOUS FIXATION;  Surgeon: Marguerita Beards, MD;  Location: [facility];  Service: Gynecology;  Laterality: N/A;   BREAST BIOPSY Left 2002   neg   BREAST CYST ASPIRATION Left 2000   neg   COLONOSCOPY WITH PROPOFOL N/A 09/07/2021   Procedure: COLONOSCOPY WITH PROPOFOL;  Surgeon: Regis Bill, MD;  Location: [facility];  Service: Endoscopy;  Laterality: N/A;  SPANISH INTERPRETER   CYSTOSCOPY N/A 07/17/2020   Procedure:  CYSTOSCOPY;  Surgeon: Marguerita Beards, MD;  Location: [facility];  Service: Gynecology;  Laterality: N/A;   FLAT FOOT RECONSTRUCTION-TAL GASTROC RECESSION Right 01/26/2016   Procedure: FLAT FOOT RECONSTRUCTION-TAL GASTROC RECESSION;  Surgeon: Gwyneth Revels, DPM;  Location: [facility];  Service: Podiatry;  Laterality: Right;   FOOT ARTHRODESIS Right 01/26/2016   Procedure: ARTHRODESIS FOOT;  Surgeon: Gwyneth Revels, DPM;  Location: [facility];  Service: Podiatry;  Laterality: Right;   GANGLION CYST EXCISION Left 12/14/2020   Procedure: Left dorsal ganglion excision;  Surgeon: Kennedy Bucker, MD;  Location: [facility];  Service: Orthopedics;  Laterality: Left;   HEMORRHOID SURGERY  12-18-2012  @ [facility]   RECTOCELE REPAIR N/A 07/17/2020   Procedure: POSTERIOR REPAIR (RECTOCELE);  Surgeon: Marguerita Beards, MD;  Location: [facility];  Service: Gynecology;  Laterality: N/A;   TRIPLE SUBTALAR FUSION Right 01/26/2016   Procedure: TRIPLE SUBTALAR FUSION;  Surgeon: Gwyneth Revels, DPM;  Location: [facility];  Service: Podiatry;  Laterality: Right;   VAGINAL HYSTERECTOMY  1990   VARICOSE VEIN SURGERY      OB History   No obstetric history on file.      Home Medications    Prior to Admission medications   Medication Sig Start Date End Date Taking? Authorizing Provider  acetaminophen (TYLENOL) 500 MG tablet Take 500 mg by mouth every 6 (six) hours as needed.   Yes [provider]  albuterol (VENTOLIN HFA) 108 (90 Base) MCG/ACT inhaler  Inhale 1-2 puffs into the lungs every 6 (six) hours as needed for wheezing or shortness of breath. 09/27/22  Yes Eusebio Friendly B, PA-C  amLODipine (NORVASC) 5 MG tablet Take 5 mg by mouth daily.   Yes [provider]  calcium-vitamin D (OSCAL WITH D) 500-5 MG-MCG tablet Take 1 tablet by mouth.   Yes [provider]  cetirizine (ZYRTEC) 10 MG tablet Take 10 mg by mouth daily.   Yes [provider]   cyanocobalamin 1000 MCG tablet Take 1,000 mcg by mouth daily.   Yes [provider]  estradiol (ESTRACE) 0.1 MG/GM vaginal cream PLACE 0.5 GIVE VAGIALLY 2 TIMES A WEEK, PLACE 0.5G NIGHTLY FOR 2 WEEKS THEN TWICE A WEEK AFTER 04/17/21  Yes Reva Bores, MD  fluticasone (FLONASE) 50 MCG/ACT nasal spray Place 2 sprays into both nostrils daily. 08/15/21  Yes [provider]  gabapentin (NEURONTIN) 100 MG capsule Take 1 capsule by mouth at bedtime. 06/21/22 06/21/23 Yes [provider]  ibuprofen (ADVIL) 200 MG tablet Take 400 mg by mouth every 6 (six) hours as needed for moderate pain.   Yes [provider]  levocetirizine (XYZAL) 5 MG tablet Take 1 tablet by mouth every evening. 08/29/21 11/30/22 Yes [provider]  lidocaine (XYLOCAINE) 2 % solution Use as directed 15 mLs in the mouth or throat every 3 (three) hours as needed for mouth pain (swish and spit). 09/27/22  Yes Eusebio Friendly B, PA-C  pantoprazole (PROTONIX) 40 MG tablet Take 40 mg by mouth in the morning.   Yes [provider]  promethazine-dextromethorphan (PROMETHAZINE-DM) 6.25-15 MG/5ML syrup Take 5 mLs by mouth 4 (four) times daily as needed. 09/27/22  Yes Eusebio Friendly B, PA-C  rosuvastatin (CRESTOR) 10 MG tablet Take 10 mg by mouth at bedtime. 10/16/20  Yes [provider]  vitamin C (ASCORBIC ACID) 500 MG tablet Take 500 mg by mouth daily.   Yes [provider]    Family History Family History  Problem Relation Age of Onset   Breast cancer Other     Social History Social History   Tobacco Use   Smoking status: Never   Smokeless tobacco: Never  Vaping Use   Vaping status: Never Used  Substance Use Topics   Alcohol use: No   Drug use: Never     Allergies   Patient has no known allergies.   Review of Systems Review of Systems  Constitutional:  Negative for fever.  HENT:  Positive for congestion.   Respiratory:  Positive for cough.   All other  systems reviewed and are negative.    Physical Exam Triage Vital Signs ED Triage Vitals [11/30/22 0852]  Encounter Vitals Group     BP      Systolic BP Percentile      Diastolic BP Percentile      Pulse      Resp 16     Temp      Temp Source Oral     SpO2      Weight      Height      Head Circumference      Peak Flow      Pain Score      Pain Loc      Pain Education      Exclude from Growth Chart    No data found.  Updated Vital Signs BP (!) 147/78 (BP Location: Left Arm)   Pulse 70   Temp 98.5 F (36.9 C) (Oral)  Resp 16   SpO2 100%   Visual Acuity Right Eye Distance:   Left Eye Distance:   Bilateral Distance:    Right Eye Near:   Left Eye Near:    Bilateral Near:     Physical Exam Vitals and nursing note reviewed.  Constitutional:      General: She is not in acute distress.    Appearance: She is well-developed and well-groomed.  HENT:     Head: Normocephalic and atraumatic.  Eyes:     Conjunctiva/sclera: Conjunctivae normal.  Cardiovascular:     Rate and Rhythm: Normal rate and regular rhythm.     Pulses: Normal pulses.     Heart sounds: Normal heart sounds. No murmur heard. Pulmonary:     Effort: Pulmonary effort is normal. No respiratory distress.     Breath sounds: Normal breath sounds and air entry.  Abdominal:     Palpations: Abdomen is soft.     Tenderness: There is no abdominal tenderness.  Musculoskeletal:        General: No swelling.     Cervical back: Neck supple.  Skin:    General: Skin is warm and dry.     Capillary Refill: Capillary refill takes less than 2 seconds.  Neurological:     General: No focal deficit present.     Mental Status: She is alert and oriented to person, place, and time.     GCS: GCS eye subscore is 4. GCS verbal subscore is 5. GCS motor subscore is 6.  Psychiatric:        Attention and Perception: Attention normal.        Mood and Affect: Mood normal.        Speech: Speech normal.        Behavior:  Behavior normal. Behavior is cooperative.      UC Treatments / Results  Labs (all labs ordered are listed, but only abnormal results are displayed) Labs Reviewed  SARS CORONAVIRUS 2 BY RT PCR    EKG   Radiology DG Chest 2 View  Result Date: 11/30/2022 CLINICAL DATA:  Cough and congestion EXAM: CHEST - 2 VIEW COMPARISON:  None Available. FINDINGS: Normal cardiac silhouette. Large hiatal hernia. Lungs are clear. Mild hyperinflation lungs. No acute osseous abnormality. No pneumothorax. IMPRESSION: 1. No acute cardiopulmonary process. 2. Large hiatal hernia. Electronically Signed   By: Genevive Bi M.D.   On: 11/30/2022 09:32    Procedures Procedures (including critical care time)  Medications Ordered in UC Medications - No data to display  Initial Impression / Assessment and Plan / UC Course  I have reviewed the triage vital signs and the nursing notes.  Pertinent labs & imaging results that were available during my care of the patient were reviewed by me and considered in my medical decision making (see chart for details).     Ddx: URI, hiatal hernia, allergies Final Clinical Impressions(s) / UC Diagnoses   Final diagnoses:  Viral upper respiratory tract infection  Hiatal hernia     Discharge Instructions      Your CXR shows no acute respiratory process/no pneumonia; large hiatal hernia, please follow up with surgeon regarding management/repair.   Your covid test was negative. Most likely you have a viral illness: no antibiotic as indicated at this time, May treat with OTC meds of choice(Mucinex, chloraseptic throat lozenges flonase nasal spray,etc). Make sure to drink plenty of fluids to stay hydrated(gatorade, water, popsicles,jello,etc), avoid caffeine products. Follow up with PCP. Return as needed.  If you develop worsening issues or concerns go to  ER for further evaluation.     ED Prescriptions   None    PDMP not reviewed this encounter.   Clancy Gourd, NP 11/30/22 1615

## 2022-11-30 NOTE — Discharge Instructions (Addendum)
Your CXR shows no acute respiratory process/no pneumonia; large hiatal hernia, please follow up with surgeon regarding management/repair.   Your covid test was negative. Most likely you have a viral illness: no antibiotic as indicated at this time, May treat with OTC meds of choice(Mucinex, chloraseptic throat lozenges flonase nasal spray,etc). Make sure to drink plenty of fluids to stay hydrated(gatorade, water, popsicles,jello,etc), avoid caffeine products. Follow up with PCP. Return as needed.  If you develop worsening issues or concerns go to  ER for further evaluation.

## 2022-11-30 NOTE — ED Triage Notes (Addendum)
Pt c/o cough & congestion x4 days. States yesterday was coughing up phlegm yesterday with blood. Has tried tylenol w/o relief.

## 2022-12-13 ENCOUNTER — Other Ambulatory Visit: Payer: Self-pay | Admitting: Internal Medicine

## 2022-12-13 DIAGNOSIS — Z1231 Encounter for screening mammogram for malignant neoplasm of breast: Secondary | ICD-10-CM

## 2022-12-20 ENCOUNTER — Ambulatory Visit
Admission: RE | Admit: 2022-12-20 | Discharge: 2022-12-20 | Disposition: A | Discharge status: Home / Self Care | Payer: Medicare Other | Source: Ambulatory Visit | Attending: Internal Medicine | Admitting: Internal Medicine

## 2022-12-20 DIAGNOSIS — Z1231 Encounter for screening mammogram for malignant neoplasm of breast: Secondary | ICD-10-CM

## 2023-01-31 IMAGING — CR DG CHEST 2V
2 series · 2 of 2 positions shown · non-contrast
Comparison: 08/31/2021

CLINICAL DATA: Cough.  Diagnosed with bronchitis 2 weeks ago.

EXAM:
CHEST - 2 VIEW

[chest pa]
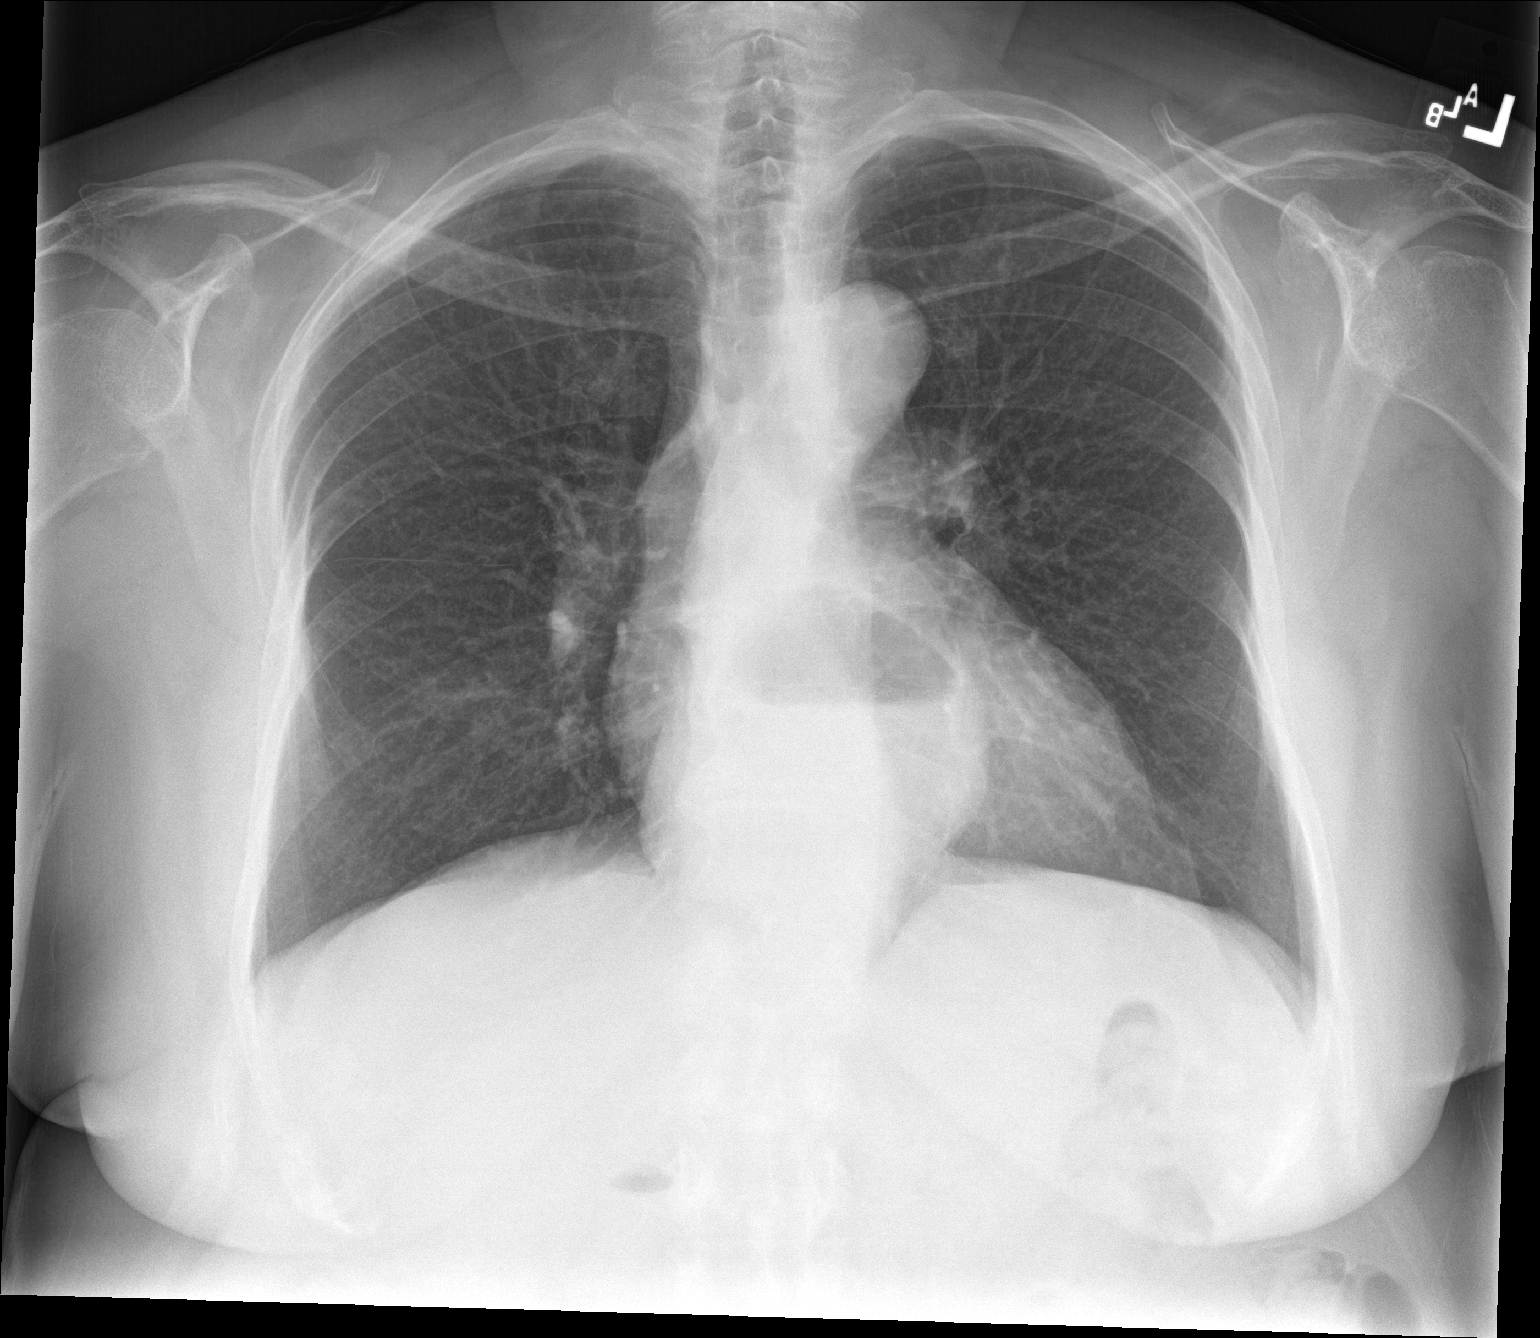

[chest lat]
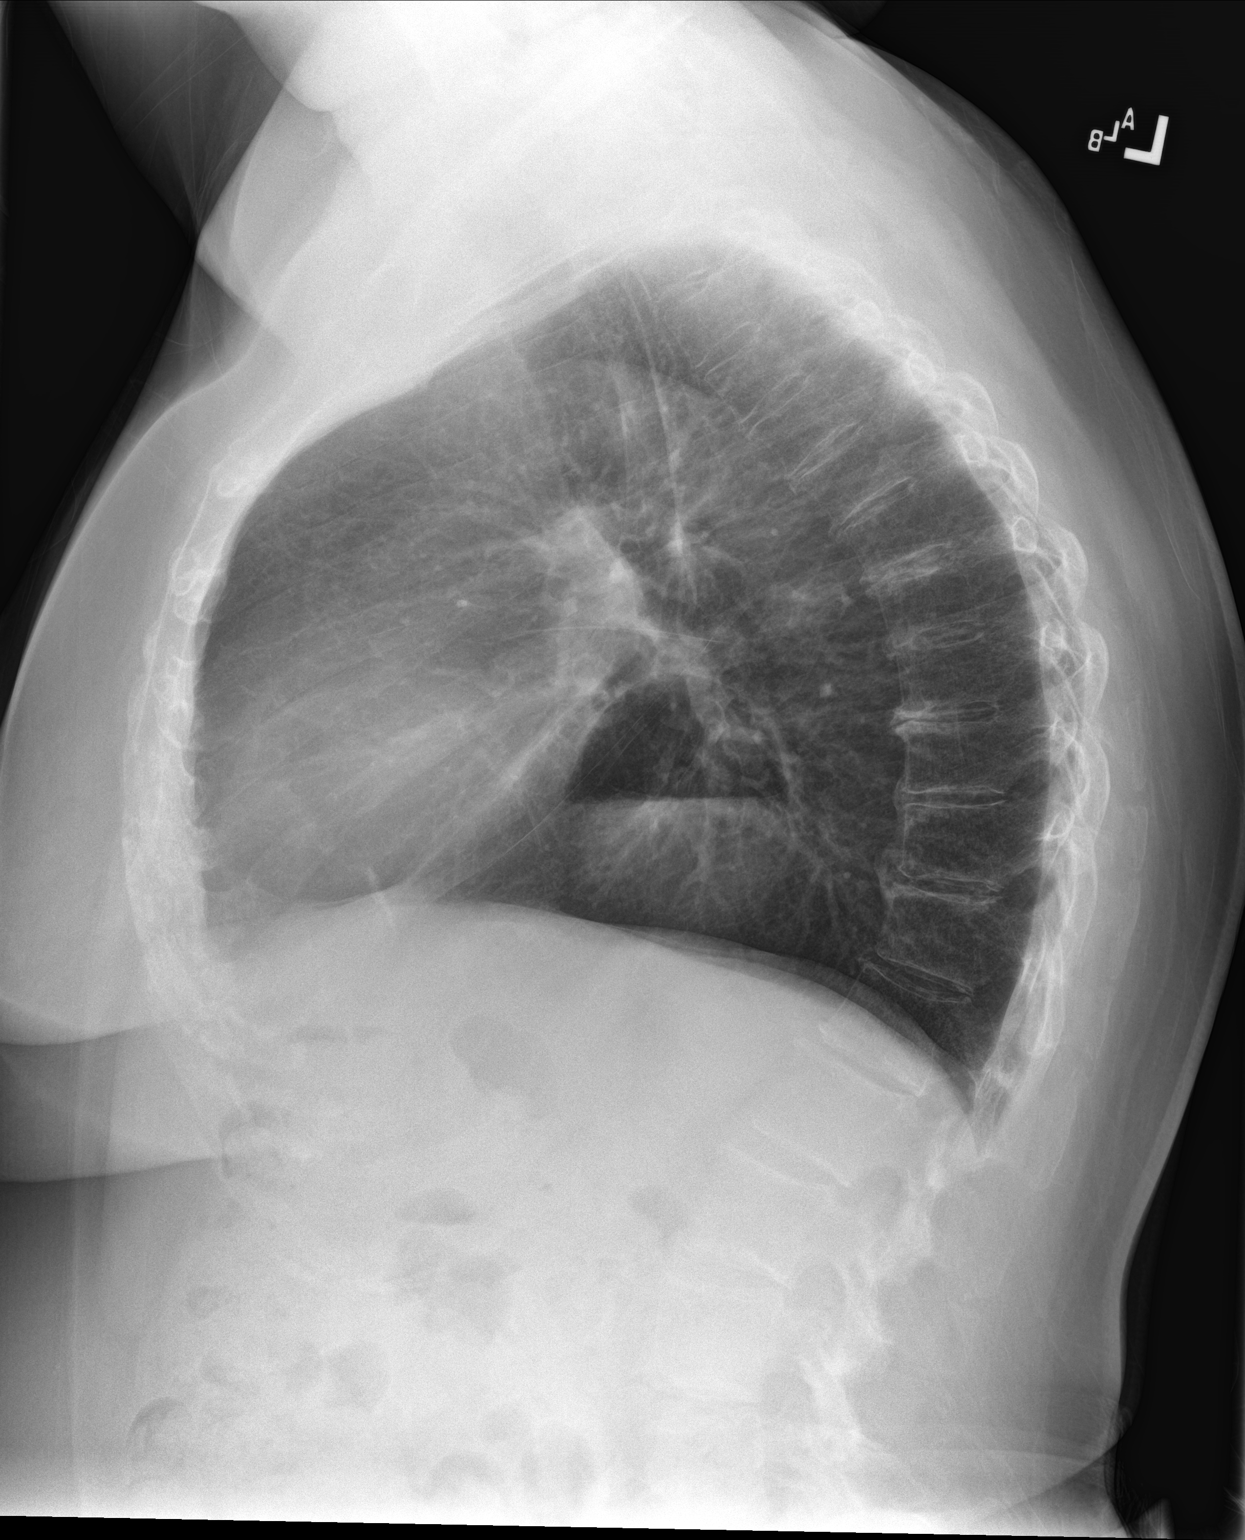

[2 of 2 positions shown; findings below may reference images not displayed]

FINDINGS: Normal sized heart. Stable moderately large hiatal hernia with an
air-fluid level. Tortuous aorta. Clear lungs with normal
vascularity. Thoracic spine degenerative changes. The bones appear
osteopenic.
IMPRESSION: 1. No acute abnormality.
2. Stable moderately large hiatal hernia.

## 2023-04-22 ENCOUNTER — Encounter (INDEPENDENT_AMBULATORY_CARE_PROVIDER_SITE_OTHER): Payer: Self-pay

## 2023-09-09 ENCOUNTER — Encounter (INDEPENDENT_AMBULATORY_CARE_PROVIDER_SITE_OTHER): Payer: Self-pay

## 2023-11-07 ENCOUNTER — Other Ambulatory Visit: Payer: Self-pay | Admitting: Internal Medicine

## 2023-11-07 DIAGNOSIS — Z1231 Encounter for screening mammogram for malignant neoplasm of breast: Secondary | ICD-10-CM

## 2023-12-26 ENCOUNTER — Ambulatory Visit
Admission: RE | Admit: 2023-12-26 | Discharge: 2023-12-26 | Disposition: A | Discharge status: Home / Self Care | Source: Ambulatory Visit | Attending: Internal Medicine | Admitting: Internal Medicine

## 2023-12-26 DIAGNOSIS — Z1231 Encounter for screening mammogram for malignant neoplasm of breast: Secondary | ICD-10-CM | POA: Diagnosis present
